# Patient Record
Sex: Female | Born: 1985 | Hispanic: No | Marital: Single | Smoking: Never smoker
Health system: Southern US, Community
[De-identification: ages and names within clinical notes are randomized; demographics above are authoritative.]

## PROBLEM LIST (undated history)

## (undated) DIAGNOSIS — E039 Hypothyroidism, unspecified: Secondary | ICD-10-CM

## (undated) DIAGNOSIS — N809 Endometriosis, unspecified: Secondary | ICD-10-CM

## (undated) DIAGNOSIS — I872 Venous insufficiency (chronic) (peripheral): Secondary | ICD-10-CM

---
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
## 2019-07-29 ENCOUNTER — Other Ambulatory Visit: Payer: Self-pay

## 2019-07-29 ENCOUNTER — Emergency Department (HOSPITAL_COMMUNITY): Payer: BC Managed Care – PPO

## 2019-07-29 ENCOUNTER — Emergency Department (HOSPITAL_COMMUNITY)
Admission: EM | Admit: 2019-07-29 | Discharge: 2019-07-29 | Disposition: A | Discharge status: Home / Self Care | Payer: BC Managed Care – PPO | Attending: Emergency Medicine | Admitting: Emergency Medicine

## 2019-07-29 ENCOUNTER — Encounter (HOSPITAL_COMMUNITY): Payer: Self-pay | Admitting: Emergency Medicine

## 2019-07-29 DIAGNOSIS — E039 Hypothyroidism, unspecified: Secondary | ICD-10-CM | POA: Insufficient documentation

## 2019-07-29 DIAGNOSIS — R55 Syncope and collapse: Secondary | ICD-10-CM | POA: Diagnosis present

## 2019-07-29 DIAGNOSIS — Z20822 Contact with and (suspected) exposure to covid-19: Secondary | ICD-10-CM | POA: Insufficient documentation

## 2019-07-29 HISTORY — DX: Venous insufficiency (chronic) (peripheral): I87.2

## 2019-07-29 HISTORY — DX: Endometriosis, unspecified: N80.9

## 2019-07-29 HISTORY — DX: Hypothyroidism, unspecified: E03.9

## 2019-07-29 LAB — CBC
HCT: 41.5 % (ref 36.0–46.0)
Hemoglobin: 13.5 g/dL (ref 12.0–15.0)
MCH: 27.9 pg (ref 26.0–34.0)
MCHC: 32.5 g/dL (ref 30.0–36.0)
MCV: 85.7 fL (ref 80.0–100.0)
Platelets: 275 10*3/uL (ref 150–400)
RBC: 4.84 MIL/uL (ref 3.87–5.11)
RDW: 13.6 % (ref 11.5–15.5)
WBC: 9.4 10*3/uL (ref 4.0–10.5)
nRBC: 0 % (ref 0.0–0.2)

## 2019-07-29 LAB — BASIC METABOLIC PANEL
Anion gap: 8 (ref 5–15)
BUN: 11 mg/dL (ref 6–20)
CO2: 25 mmol/L (ref 22–32)
Calcium: 9.2 mg/dL (ref 8.9–10.3)
Chloride: 106 mmol/L (ref 98–111)
Creatinine, Ser: 0.91 mg/dL (ref 0.44–1.00)
GFR calc Af Amer: >60 mL/min (ref >60)
GFR calc non Af Amer: >60 mL/min (ref >60)
Glucose, Bld: 90 mg/dL (ref 70–99)
Potassium: 4.1 mmol/L (ref 3.5–5.1)
Sodium: 139 mmol/L (ref 135–145)

## 2019-07-29 LAB — I-STAT BETA HCG BLOOD, ED (MC, WL, AP ONLY): I-stat hCG, quantitative: <5 m[IU]/mL (ref <5)

## 2019-07-29 LAB — URINALYSIS, ROUTINE W REFLEX MICROSCOPIC
Bilirubin Urine: NEGATIVE
Glucose, UA: NEGATIVE mg/dL
Hgb urine dipstick: NEGATIVE
Ketones, ur: 20 mg/dL — AB
Leukocytes,Ua: NEGATIVE
Nitrite: NEGATIVE
Protein, ur: NEGATIVE mg/dL
Specific Gravity, Urine: 1.016 (ref 1.005–1.030)
pH: 5 (ref 5.0–8.0)

## 2019-07-29 LAB — D-DIMER, QUANTITATIVE: D-Dimer, Quant: <0.27 ug/mL-FEU (ref 0.00–0.50)

## 2019-07-29 LAB — TSH: TSH: 1.455 u[IU]/mL (ref 0.350–4.500)

## 2019-07-29 LAB — CBG MONITORING, ED
Glucose-Capillary: 70 mg/dL (ref 70–99)
Glucose-Capillary: 80 mg/dL (ref 70–99)

## 2019-07-29 LAB — TROPONIN I (HIGH SENSITIVITY)
Troponin I (High Sensitivity): <2 ng/L (ref <18)
Troponin I (High Sensitivity): <2 ng/L (ref <18)

## 2019-07-29 LAB — SARS CORONAVIRUS 2 BY RT PCR (HOSPITAL ORDER, PERFORMED IN ~~LOC~~ HOSPITAL LAB): SARS Coronavirus 2: NEGATIVE

## 2019-07-29 MED ORDER — SODIUM CHLORIDE 0.9 % IV BOLUS
1000.0000 mL | Freq: Once | INTRAVENOUS | Status: AC
  Administered 2019-07-29: 1000 mL via INTRAVENOUS

## 2019-07-29 MED ORDER — SODIUM CHLORIDE 0.9% FLUSH: 3.0000 mL | Freq: Once | INTRAVENOUS | Status: DC

## 2019-07-29 NOTE — ED Triage Notes (Signed)
Pt in via [facility] w/dizziness since waking at 0400. States she woke feeling fatigued, and nauseous w/ambulation. Denies any fevers, v/d or sick contacts. BP 104/72, runs low baseline, CBG 119, MAE's equally

## 2019-07-29 NOTE — ED Notes (Signed)
Patient verbalizes understanding of discharge instructions. Opportunity for questioning and answers were provided. Armband removed by staff, pt discharged from ED.  

## 2019-07-29 NOTE — ED Notes (Signed)
Urine culture collected with UA °

## 2019-07-29 NOTE — Discharge Instructions (Addendum)
You were seen in the emergency department today for chest pain. Your work-up in the emergency department has been overall reassuring. Your labs have been fairly normal and or similar to previous blood work you have had done. Your EKG and the enzyme we use to check your heart did not show an acute heart attack at this time. Your chest x-ray was normal.   Please be sure to stay well-hydrated and eat 3 meals per day.  We would like you to follow up closely with your primary care provider within 1 - 3 days. Return to the ER immediately should you experience any new or worsening symptoms including but not limited to return of chest pain, worsened pain, vomiting, shortness of breath, dizziness, lightheadedness, passing out, or any other concerns that you may have.   See results below:  We tested you for COVID-19, we will call you if these results are positive, you may also view positive results on MyChart, if positive you will need to quarantine, see attached handout. Results for orders placed or performed during the hospital encounter of 63/01/60  Basic metabolic panel  Result Value Ref Range   Sodium 139 135 - 145 mmol/L   Potassium 4.1 3.5 - 5.1 mmol/L   Chloride 106 98 - 111 mmol/L   CO2 25 22 - 32 mmol/L   Glucose, Bld 90 70 - 99 mg/dL   BUN 11 6 - 20 mg/dL   Creatinine, Ser 0.91 0.44 - 1.00 mg/dL   Calcium 9.2 8.9 - 10.3 mg/dL   GFR calc non Af Amer >60 >60 mL/min   GFR calc Af Amer >60 >60 mL/min   Anion gap 8 5 - 15  CBC  Result Value Ref Range   WBC 9.4 4.0 - 10.5 K/uL   RBC 4.84 3.87 - 5.11 MIL/uL   Hemoglobin 13.5 12.0 - 15.0 g/dL   HCT 41.5 36.0 - 46.0 %   MCV 85.7 80.0 - 100.0 fL   MCH 27.9 26.0 - 34.0 pg   MCHC 32.5 30.0 - 36.0 g/dL   RDW 13.6 11.5 - 15.5 %   Platelets 275 150 - 400 K/uL   nRBC 0.0 0.0 - 0.2 %  Urinalysis, Routine w reflex microscopic  Result Value Ref Range   Color, Urine YELLOW YELLOW   APPearance CLEAR CLEAR   Specific Gravity, Urine 1.016 1.005 -  1.030   pH 5.0 5.0 - 8.0   Glucose, UA NEGATIVE NEGATIVE mg/dL   Hgb urine dipstick NEGATIVE NEGATIVE   Bilirubin Urine NEGATIVE NEGATIVE   Ketones, ur 20 (A) NEGATIVE mg/dL   Protein, ur NEGATIVE NEGATIVE mg/dL   Nitrite NEGATIVE NEGATIVE   Leukocytes,Ua NEGATIVE NEGATIVE  D-dimer, quantitative (not at [facility])  Result Value Ref Range   D-Dimer, Quant <0.27 0.00 - 0.50 ug/mL-FEU  TSH  Result Value Ref Range   TSH 1.455 0.350 - 4.500 uIU/mL  CBG monitoring, ED  Result Value Ref Range   Glucose-Capillary 70 70 - 99 mg/dL  I-Stat beta hCG blood, ED  Result Value Ref Range   I-stat hCG, quantitative <5.0 <5 mIU/mL   Comment 3          CBG monitoring, ED  Result Value Ref Range   Glucose-Capillary 80 70 - 99 mg/dL  Troponin I (High Sensitivity)  Result Value Ref Range   Troponin I (High Sensitivity) <2 <18 ng/L  Troponin I (High Sensitivity)  Result Value Ref Range   Troponin I (High Sensitivity) <2 <18 ng/L  DG Chest 2 View  Result Date: 07/29/2019 CLINICAL DATA:  Dyspnea.  Dizziness beginning this morning. EXAM: CHEST - 2 VIEW COMPARISON:  None. FINDINGS: The heart size and mediastinal contours are within normal limits. Both lungs are clear. No pleural effusion or pneumothorax. The visualized skeletal structures are unremarkable. IMPRESSION: Normal chest radiographs. Electronically Signed   By: Amie Portland M.D.   On: 07/29/2019 10:25

## 2019-07-29 NOTE — ED Provider Notes (Signed)
[facility] EMERGENCY DEPARTMENT Provider Note   CSN: [id number] Arrival date & time: 07/29/19  6644     History Chief Complaint  Patient presents with  . Near Syncope    Judith Bruce is a 34 y.o. female with a history of hypothyroidism, chronic venous insufficiency, and endometriosis who presents to the ED with complaints of near syncope this AM. Patient states she woke from sleep and went to go check on her sister, as she was walking there she began to feel overheated & lightheaded as if she may pass out with some associated dyspnea. She sat down on the couch and did not move as she was afraid if she did she would have LOC. No other alleviating/aggravating factors. She feels better now, but remains a bit lightheaded and short of breath. She states after feeling as if she my pass out she also developed some mild chest discomfort that is substernal, non-radiating, and difficult to qualify.  She denies fever, emesis, abdominal pain, acute unilatera leg pain/swelling, hemoptysis, recent surgery/trauma, recent long travel, hormone use, personal hx of cancer, or hx of DVT/PE.  She had not had anything to eat or drink this morning and did not eat dinner last night.  No recent medication changes. She has had similar sxs previously.   HPI     Past Medical History:  Diagnosis Date  . Chronic venous insufficiency   . Endometriosis   . Hypothyroidism     There are no problems to display for this patient.   History reviewed. No pertinent surgical history.   OB History   No obstetric history on file.     History reviewed. No pertinent family history.  Social History   Tobacco Use  . Smoking status: Never Smoker  . Smokeless tobacco: Never Used  Substance Use Topics  . Alcohol use: Never  . Drug use: Never    Home Medications Prior to Admission medications   Not on File    Allergies    Patient has no allergy information on record.  Review of Systems     Review of Systems  Constitutional: Negative for chills and fever.  Respiratory: Positive for shortness of breath.   Cardiovascular: Positive for chest pain. Negative for leg swelling.  Gastrointestinal: Negative for abdominal pain, blood in stool, constipation, diarrhea and vomiting.  Genitourinary: Negative for dysuria.  Neurological: Positive for light-headedness. Negative for syncope.  All other systems reviewed and are negative.   Physical Exam Updated Vital Signs BP 112/77   Pulse 66   Temp (!) 97.5 F (36.4 C) (Oral)   Resp 11   Wt 58.5 kg   LMP 03/21/2019 Comment: birth control  SpO2 100%   Physical Exam Vitals and nursing note reviewed.  Constitutional:      General: She is not in acute distress.    Appearance: She is well-developed. She is not toxic-appearing.  HENT:     Head: Normocephalic and atraumatic.  Eyes:     General:        Right eye: No discharge.        Left eye: No discharge.     Conjunctiva/sclera: Conjunctivae normal.  Cardiovascular:     Rate and Rhythm: Normal rate and regular rhythm.     Pulses:          Radial pulses are 2+ on the right side and 2+ on the left side.     Heart sounds: No murmur.  Pulmonary:     Effort:  Pulmonary effort is normal. No respiratory distress.     Breath sounds: Normal breath sounds. No wheezing, rhonchi or rales.  Abdominal:     General: There is no distension.     Palpations: Abdomen is soft.     Tenderness: There is no abdominal tenderness.  Musculoskeletal:     Cervical back: Neck supple.     Right lower leg: No edema.     Left lower leg: No edema.  Skin:    General: Skin is warm and dry.     Findings: No rash.  Neurological:     Mental Status: She is alert.     Comments: Clear speech.  CN II to XII grossly intact.  Sensation and strength grossly intact throughout bilateral upper and lower extremities.  Psychiatric:        Behavior: Behavior normal.    ED Results / Procedures / Treatments    Labs (all labs ordered are listed, but only abnormal results are displayed) Labs Reviewed  URINALYSIS, ROUTINE W REFLEX MICROSCOPIC - Abnormal; Notable for the following components:      Result Value   Ketones, ur 20 (*)    All other components within normal limits  SARS CORONAVIRUS 2 BY RT PCR (HOSPITAL ORDER, PERFORMED IN [facility])  BASIC METABOLIC PANEL  CBC  D-DIMER, QUANTITATIVE (NOT AT [facility])  TSH  CBG MONITORING, ED  I-STAT BETA HCG BLOOD, ED ([facility], [facility], [facility] ONLY)  CBG MONITORING, ED  TROPONIN I (HIGH SENSITIVITY)  TROPONIN I (HIGH SENSITIVITY)    EKG EKG Interpretation  Date/Time:  Monday Jul 29 2019 07:09:53 EDT Ventricular Rate:  67 PR Interval:  138 QRS Duration: 82 QT Interval:  386 QTC Calculation: 407 R Axis:   99 Text Interpretation: Normal sinus rhythm Rightward axis Borderline ECG Confirmed by Virgina Norfolk [phone]) on 07/29/2019 9:04:42 AM   Radiology DG Chest 2 View  Result Date: 07/29/2019 CLINICAL DATA:  Dyspnea.  Dizziness beginning this morning. EXAM: CHEST - 2 VIEW COMPARISON:  None. FINDINGS: The heart size and mediastinal contours are within normal limits. Both lungs are clear. No pleural effusion or pneumothorax. The visualized skeletal structures are unremarkable. IMPRESSION: Normal chest radiographs. Electronically Signed   By: Amie Portland M.D.   On: 07/29/2019 10:25    Procedures Procedures (including critical care time)  1:30PM Cardiac monitoring reveals NSR at a rate of 64 bpm, as reviewed and interpreted by me. Cardiac monitoring was ordered due to near syncopal episode and to monitor patient for dysrhythmia.  Medications Ordered in ED Medications  sodium chloride flush (NS) 0.9 % injection 3 mL (3 mLs Intravenous Not Given 07/29/19 0951)  sodium chloride 0.9 % bolus 1,000 mL (1,000 mLs Intravenous New Bag/Given 07/29/19 1119)    ED Course  I have reviewed the triage vital signs and the nursing notes.  Pertinent labs &  imaging results that were available during my care of the patient were reviewed by me and considered in my medical decision making (see chart for details).    Judith Bruce was evaluated in Emergency Department on 07/29/2019 for the symptoms described in the history of present illness. He/she was evaluated in the context of the global COVID-19 pandemic, which necessitated consideration that the patient might be at risk for infection with the SARS-CoV-2 virus that causes COVID-19. Institutional protocols and algorithms that pertain to the evaluation of patients at risk for COVID-19 are in a state of rapid change based on information released by regulatory  bodies including the CDC and federal and state organizations. These policies and algorithms were followed during the patient's care in the ED.  MDM Rules/Calculators/A&P                      Patient presents to the ED with complaints of near syncope, had some chest discomfort following this. Nontoxic, vitals without significant abnormality, benign physical exam.   Additional history obtained:  Additional history obtained from nursing note and chart review.  EKG: Normal sinus rhythm Rightward axis Borderline ECG  Lab Tests:  I Ordered, reviewed, and interpreted labs, which included:  CBC: No anemia or leukocytosis. BMP: No significant electrolyte derangement.  Renal function preserved. Pregnancy test: Negative D-dimer: Within normal limits Troponin: No significant elevation TSH: Within normal limits Urinalysis: Ketonuria, no UTI. Imaging Studies ordered:  I ordered imaging studies which included chest xray, I independently visualized and interpreted imaging which was normal.  ED Course:  Patient with near syncopal episode with mild chest discomfort following.  Overall reassuring exam and work-up in the emergency department.  She has no focal neurologic deficits.  Her orthostatic vitals were not significantly abnormal. Orthostatic VS for the  past 24 hrs:  BP- Lying Pulse- Lying BP- Sitting Pulse- Sitting  07/29/19 1040 112/54 61 116/71 65    Her labs do not indicate renal failure, electrolyte derangement, critical anemia, or significant thyroid dysfunction.  Chest x-ray without underlying pathology.  Low risk HEAR score, EKG without significant ischemic changes, troponins are not significantly elevated, low suspicion for ACS.  Low risk Wells, D-dimer within normal limits, low suspicion for pulmonary embolism.  No widened mediastinum on chest x-ray, symmetric pulses, normotensive, doubt dissection.  Cardiac monitor has been reviewed and is overall reassuring without significant arrhythmias.  Patient given fluids and is tolerating p.o.  She overall appears appropriate for discharge home with primary care follow-up.  The last time she had a syncopal episode was when she had the swine flu, Covid testing ordered per discussion with patient. I Discussed importance of good hydration and eating 3 meals per day. I discussed results, treatment plan, need for follow-up, and return precautions with the patient. Provided opportunity for questions, patient confirmed understanding and is in agreement with plan.   Portions of this note were generated with [employer]. Dictation errors may occur despite best attempts at proofreading.  Final Clinical Impression(s) / ED Diagnoses Final diagnoses:  Near syncope    Rx / DC Orders ED Discharge Orders    None       Amaryllis Dyke, PA-C 07/29/19 1443    Lennice Sites, DO 07/29/19 1456

## 2021-05-12 ENCOUNTER — Emergency Department (HOSPITAL_BASED_OUTPATIENT_CLINIC_OR_DEPARTMENT_OTHER): Payer: Managed Care, Other (non HMO) | Admitting: Radiology

## 2021-05-12 ENCOUNTER — Other Ambulatory Visit: Payer: Self-pay

## 2021-05-12 ENCOUNTER — Emergency Department (HOSPITAL_BASED_OUTPATIENT_CLINIC_OR_DEPARTMENT_OTHER)
Admission: EM | Admit: 2021-05-12 | Discharge: 2021-05-12 | Disposition: A | Discharge status: Home / Self Care | Payer: Managed Care, Other (non HMO) | Attending: Emergency Medicine | Admitting: Emergency Medicine

## 2021-05-12 ENCOUNTER — Encounter (HOSPITAL_BASED_OUTPATIENT_CLINIC_OR_DEPARTMENT_OTHER): Payer: Self-pay | Admitting: Emergency Medicine

## 2021-05-12 DIAGNOSIS — M25531 Pain in right wrist: Secondary | ICD-10-CM | POA: Diagnosis not present

## 2021-05-12 DIAGNOSIS — M79643 Pain in unspecified hand: Secondary | ICD-10-CM

## 2021-05-12 DIAGNOSIS — S6991XA Unspecified injury of right wrist, hand and finger(s), initial encounter: Secondary | ICD-10-CM

## 2021-05-12 DIAGNOSIS — S63391A Traumatic rupture of other ligament of right wrist, initial encounter: Secondary | ICD-10-CM | POA: Diagnosis not present

## 2021-05-12 DIAGNOSIS — Y92481 Parking lot as the place of occurrence of the external cause: Secondary | ICD-10-CM | POA: Insufficient documentation

## 2021-05-12 DIAGNOSIS — R52 Pain, unspecified: Secondary | ICD-10-CM

## 2021-05-12 NOTE — Discharge Instructions (Addendum)
As we discussed, your work-up was reassuring for acute abnormalities.  I do suspect that you have a wrist sprain given your x-ray.  I have given you a brace for support for management of this.  In addition given the your area of tenderness at higher risk of having a missed scaphoid fracture on your x-ray.  If this continues to be a problematic, I recommend you follow-up with orthopedics for repeat imaging and evaluation.  In the interim, I recommend rest, ice, and elevation of areas of pain with Tylenol/ibuprofen as needed. ? ?Return if development of any new or worsening symptoms. ?
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
## 2021-05-12 NOTE — ED Notes (Signed)
Pt verbalizes understanding of discharge instructions. Opportunity for questioning and answers were provided. Pt discharged from ED to home.   ? ?

## 2021-08-13 ENCOUNTER — Ambulatory Visit: Payer: Managed Care, Other (non HMO) | Admitting: Physician Assistant

## 2021-08-16 ENCOUNTER — Ambulatory Visit (INDEPENDENT_AMBULATORY_CARE_PROVIDER_SITE_OTHER): Payer: Managed Care, Other (non HMO) | Admitting: Physician Assistant

## 2021-08-16 ENCOUNTER — Encounter: Payer: Self-pay | Admitting: Physician Assistant

## 2021-08-16 DIAGNOSIS — M25531 Pain in right wrist: Secondary | ICD-10-CM | POA: Diagnosis not present
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
## 2021-08-25 ENCOUNTER — Ambulatory Visit
Admission: RE | Admit: 2021-08-25 | Discharge: 2021-08-25 | Disposition: A | Discharge status: Home / Self Care | Payer: Managed Care, Other (non HMO) | Source: Ambulatory Visit | Attending: Physician Assistant | Admitting: Physician Assistant

## 2021-08-25 DIAGNOSIS — M25531 Pain in right wrist: Secondary | ICD-10-CM

## 2021-08-27 ENCOUNTER — Telehealth: Payer: Self-pay

## 2021-08-27 ENCOUNTER — Ambulatory Visit: Payer: Managed Care, Other (non HMO) | Admitting: Podiatry

## 2021-08-27 DIAGNOSIS — L603 Nail dystrophy: Secondary | ICD-10-CM | POA: Diagnosis not present

## 2021-08-27 NOTE — Telephone Encounter (Signed)
Patient has been contacted and appointment has been made.  

## 2021-08-27 NOTE — Telephone Encounter (Signed)
-----   Message from [facility] Persons, [place] sent at 08/27/2021 11:04 AM EDT ----- Can we have this patient get an MRI follow up with Dr. Frazier Butt? thanks ----- Message ----- From: Interface, Rad Results In Sent: 08/27/2021  10:59 AM EDT To: [place] Persons, [place]

## 2021-08-27 NOTE — Progress Notes (Signed)
   HPI: 36 y.o. female presenting today as a new patient for evaluation of an injury to the right great toenail.  Patient states that about 3-4 months ago she got a new pair of dancing shoes which caused injury to the right hallux toenail.  Currently she has not done anything for treatment.  She would like to have it evaluated.  She also has some numbness to the bilateral great toes that has been present for several years.  Past Medical History:  Diagnosis Date   Chronic venous insufficiency    Endometriosis    Hypothyroidism     No past surgical history on file.  Allergies  Allergen Reactions   Bactrim [Sulfamethoxazole-Trimethoprim]     itching , dry skin, acute bodily stiffness.    Naltrexone     Liquid version - respiratory issues back in December 2020   Shellfish Allergy     Hives, itching, cough,      Physical Exam: General: The patient is alert and oriented x3 in no acute distress.  Dermatology: Skin is warm, dry and supple bilateral lower extremities. Negative for open lesions or macerations.  Injury noted to the right hallux nail plate with some nail dystrophy to the medial portion of the nail.  There is some healthy regrowth along the base of the nail  Vascular: Palpable pedal pulses bilaterally. Capillary refill within normal limits.  Negative for any significant edema or erythema  Neurological: Light touch and protective threshold grossly intact.  Apparently there is some paresthesia with numbness with light touch along the great toe plantar medial aspect  Musculoskeletal Exam: No pedal deformities noted  Assessment: 1.  Dystrophic nail secondary to trauma right hallux nail plate with healthy regrowth at the base of the nail   Plan of Care:  1. Patient evaluated.  2.  OTC Tolcylen antifungal topical dispensed at checkout to apply to the right hallux nail plate as it grows out 3.  Recommend getting new dancing shoes that do not irritate the great toe 4.  Silicone  toe cap was dispensed to cushion the toe 5.  Return to clinic as needed  *Dances the Flamenco      Felecia Shelling, DPM [facility]  Dr. Felecia Shelling, DPM    [address] [place], [place] [address]                Office [phone]  Fax [phone]

## 2021-09-03 ENCOUNTER — Ambulatory Visit: Payer: Self-pay

## 2021-09-03 ENCOUNTER — Ambulatory Visit (INDEPENDENT_AMBULATORY_CARE_PROVIDER_SITE_OTHER): Payer: Managed Care, Other (non HMO) | Admitting: Orthopedic Surgery

## 2021-09-03 DIAGNOSIS — M25531 Pain in right wrist: Secondary | ICD-10-CM

## 2021-09-03 DIAGNOSIS — S63511A Sprain of carpal joint of right wrist, initial encounter: Secondary | ICD-10-CM

## 2021-09-07 DIAGNOSIS — S63511A Sprain of carpal joint of right wrist, initial encounter: Secondary | ICD-10-CM | POA: Insufficient documentation

## 2021-09-07 MED ORDER — MELOXICAM 7.5 MG PO TABS: 7.5000 mg | ORAL_TABLET | Freq: Every day | ORAL | 0 refills | Status: DC

## 2021-09-23 ENCOUNTER — Telehealth: Payer: Self-pay | Admitting: Orthopedic Surgery

## 2021-09-23 NOTE — Telephone Encounter (Signed)
Pt called requesting a call back from [facility] or Dr. Frazier Butt. Pt states she has been waiting for physical therapy per discussed at appt. Please send referral for physical therapy. Pt also has medical question about process of hand healing due to injury. Please call pt she was unsure should she also make an appt or wait to closer time after physical therapy appt. Explained to pt it probably would be best to wait to see progress of therapy sessions. Pt phone number is [phone].

## 2021-09-30 ENCOUNTER — Telehealth: Payer: Self-pay | Admitting: Orthopedic Surgery

## 2021-09-30 NOTE — Telephone Encounter (Signed)
Pt called again about update for call back and referral for physical therapy. Please call pt a soon as possible. She states she called last week. Pt phone number is [phone].

## 2021-10-04 ENCOUNTER — Other Ambulatory Visit: Payer: Self-pay | Admitting: Orthopedic Surgery

## 2021-10-04 DIAGNOSIS — S63511A Sprain of carpal joint of right wrist, initial encounter: Secondary | ICD-10-CM

## 2021-10-07 ENCOUNTER — Other Ambulatory Visit: Payer: Self-pay | Admitting: Orthopedic Surgery

## 2021-10-15 ENCOUNTER — Encounter: Payer: Self-pay | Admitting: Occupational Therapy

## 2021-10-15 ENCOUNTER — Ambulatory Visit: Payer: Managed Care, Other (non HMO) | Attending: Orthopedic Surgery | Admitting: Occupational Therapy

## 2021-10-15 DIAGNOSIS — M25531 Pain in right wrist: Secondary | ICD-10-CM | POA: Insufficient documentation

## 2021-10-15 DIAGNOSIS — M25631 Stiffness of right wrist, not elsewhere classified: Secondary | ICD-10-CM | POA: Diagnosis present

## 2021-10-15 DIAGNOSIS — M6281 Muscle weakness (generalized): Secondary | ICD-10-CM | POA: Diagnosis present
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
## 2021-10-18 ENCOUNTER — Ambulatory Visit: Payer: Managed Care, Other (non HMO) | Admitting: Occupational Therapy

## 2021-10-22 ENCOUNTER — Ambulatory Visit: Payer: Managed Care, Other (non HMO) | Admitting: Occupational Therapy

## 2021-10-25 ENCOUNTER — Encounter: Payer: Self-pay | Admitting: Occupational Therapy

## 2021-10-25 ENCOUNTER — Ambulatory Visit: Payer: Managed Care, Other (non HMO) | Admitting: Occupational Therapy

## 2021-10-25 DIAGNOSIS — M25631 Stiffness of right wrist, not elsewhere classified: Secondary | ICD-10-CM

## 2021-10-25 DIAGNOSIS — M25531 Pain in right wrist: Secondary | ICD-10-CM | POA: Diagnosis not present

## 2021-10-25 DIAGNOSIS — M6281 Muscle weakness (generalized): Secondary | ICD-10-CM
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
## 2021-11-05 ENCOUNTER — Ambulatory Visit: Payer: Managed Care, Other (non HMO) | Admitting: Occupational Therapy

## 2021-11-19 ENCOUNTER — Ambulatory Visit: Payer: Managed Care, Other (non HMO) | Attending: Orthopedic Surgery | Admitting: Occupational Therapy

## 2021-11-19 ENCOUNTER — Encounter: Payer: Self-pay | Admitting: Occupational Therapy

## 2021-11-19 DIAGNOSIS — M6281 Muscle weakness (generalized): Secondary | ICD-10-CM | POA: Insufficient documentation

## 2021-11-19 DIAGNOSIS — M25531 Pain in right wrist: Secondary | ICD-10-CM | POA: Insufficient documentation

## 2021-11-19 DIAGNOSIS — M25631 Stiffness of right wrist, not elsewhere classified: Secondary | ICD-10-CM | POA: Insufficient documentation
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
## 2021-11-26 ENCOUNTER — Ambulatory Visit: Payer: Managed Care, Other (non HMO) | Admitting: Occupational Therapy

## 2021-11-26 DIAGNOSIS — M25531 Pain in right wrist: Secondary | ICD-10-CM

## 2021-11-26 DIAGNOSIS — M25631 Stiffness of right wrist, not elsewhere classified: Secondary | ICD-10-CM

## 2021-11-26 DIAGNOSIS — M6281 Muscle weakness (generalized): Secondary | ICD-10-CM
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
## 2021-11-29 ENCOUNTER — Ambulatory Visit (INDEPENDENT_AMBULATORY_CARE_PROVIDER_SITE_OTHER): Payer: Managed Care, Other (non HMO) | Admitting: Rehabilitative and Restorative Service Providers"

## 2021-11-29 ENCOUNTER — Encounter: Payer: Self-pay | Admitting: Rehabilitative and Restorative Service Providers"

## 2021-11-29 DIAGNOSIS — M25631 Stiffness of right wrist, not elsewhere classified: Secondary | ICD-10-CM

## 2021-11-29 DIAGNOSIS — M25531 Pain in right wrist: Secondary | ICD-10-CM | POA: Diagnosis not present

## 2021-11-29 DIAGNOSIS — M6281 Muscle weakness (generalized): Secondary | ICD-10-CM
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
## 2021-11-30 ENCOUNTER — Ambulatory Visit: Payer: Managed Care, Other (non HMO) | Admitting: Occupational Therapy

## 2021-12-03 ENCOUNTER — Ambulatory Visit: Payer: Managed Care, Other (non HMO) | Admitting: Occupational Therapy

## 2021-12-07 ENCOUNTER — Ambulatory Visit: Payer: Managed Care, Other (non HMO) | Admitting: Occupational Therapy

## 2021-12-15 ENCOUNTER — Ambulatory Visit (INDEPENDENT_AMBULATORY_CARE_PROVIDER_SITE_OTHER): Payer: Managed Care, Other (non HMO) | Admitting: Rehabilitative and Restorative Service Providers"

## 2021-12-15 ENCOUNTER — Encounter: Payer: Self-pay | Admitting: Rehabilitative and Restorative Service Providers"

## 2021-12-15 DIAGNOSIS — M25531 Pain in right wrist: Secondary | ICD-10-CM | POA: Diagnosis not present

## 2021-12-15 DIAGNOSIS — M25631 Stiffness of right wrist, not elsewhere classified: Secondary | ICD-10-CM

## 2021-12-15 DIAGNOSIS — M6281 Muscle weakness (generalized): Secondary | ICD-10-CM

## 2021-12-15 NOTE — Therapy (Signed)
OUTPATIENT OCCUPATIONAL THERAPY TREATMENT & PROGRESS NOTE   Patient Name: DESSIREE SZE MRN: [id number] DOB:[birth year], 36 y.o., female Today's Date: 12/15/2021   PCP: not on file REFERRING PROVIDER: Sherilyn Cooter, MD   Progress Note  Reporting Period 10/15/21 to 12/15/21  See note below for Objective Data and Assessment of Progress/Goals.      END OF SESSION:   OT End of Session - 12/15/21 1034     Visit Number 6    Number of Visits 17    Date for OT Re-Evaluation 01/28/22    Authorization Type [employer]    Authorization - Visit Number 6    Authorization - Number of Visits 30    OT Start Time 3428    OT Stop Time 1114    OT Time Calculation (min) 39 min    Activity Tolerance Patient tolerated treatment well;No increased pain;Patient limited by pain;Patient limited by fatigue    Behavior During Therapy [facility] for tasks assessed/performed             Past Medical History:  Diagnosis Date   Chronic venous insufficiency    Endometriosis    Hypothyroidism    History reviewed. No pertinent surgical history. Patient Active Problem List   Diagnosis Date Noted   Sprain of right scapholunate ligament 09/07/2021   Pain in right wrist 08/16/2021    ONSET DATE: 05/12/21 DOI    REFERRING DIAG: J68.115B (ICD-10-CM) - Sprain of right scapholunate ligament    THERAPY DIAG:  Pain in right wrist  Stiffness of right wrist, not elsewhere classified  Muscle weakness (generalized)  Rationale for Evaluation and Treatment Rehabilitation  PERTINENT HISTORY: R wrist sprain w/ partial thickness tear of SL ligament; h/o EDS with generalized ligament laxity  PRECAUTIONS: Rigid custom orthosis for wrist immobilization, per Dr. Tempie Donning; Other: avoid grip and lift of objects (wrist distraction); weight bearing (compression)   SUBJECTIVE:   SUBJECTIVE STATEMENT: She arrives quite late after not coming in for ~2 weeks due to traveling and other medical visits, wearing orthotic  today. She reports that she will also be leaving the country soon for a trip. Today she states and demo's that she is able to touch thumb to RF now, but not to [facility] yet, still tender to S-L area. Arrives wearing orthotic, but states wearing much less often now. She states not feeling hypervigilant now and less guarded, but having jolts of pain at times, still.    PAIN: Are you having pain? Yes: NPRS scale: 3/10 Pain location: dorsal side of wrist Pain description: dull throb Aggravating factors: overuse/movement; sleeping Relieving factors: immobilization; compression   PLOF: Independent, Vocation/Vocational requirements: Education officer, community; computer work, and Leisure: Psychologist, clinical  PATIENT GOALS: "know best practices and techniques" for daily work, ergonomics, lifting, driving   OBJECTIVE:   UPPER EXTREMITY ROM      Active ROM Left Eval - 8/4 Right Eval - 8/4 Right 11/29/21 Right 12/15/21  Wrist flexion 79 50 21 (tender) 14 (14* PROM)  Wrist extension 71 32 44 (more tender) 33 (50* PROM)   Wrist ulnar deviation 45 24 12 37  Wrist radial deviation 25 11 18 20   Wrist pronation 58 61 92 92  Wrist supination 73 63 79 tender 82  DART THROWER    25* flexion, 45* ext (PROM approx = AROM)   (Blank rows = not tested)   12/15/21: 27# (pain point)    11/29/21: Right 6# ("pain point");  Left: 43#   OBSERVATIONS:   12/15/21:  carpals don't appear to have much motion/glide today with attempted A/PROM, also prominent swelling or possible dorsal leaning lunate noted today in dorsum of wrist. Not overly TTP.    TODAY'S TREATMENT:  12/15/21  Therapeutic Exercise She does AROM for new measures, showing improvements everywhere BUT wrist flexion and extension which continues to decrease, frustratingly. (OT decides to start mobilizing wrist bones to help with motion and glide.) HEP was reviewed briefly, OT also quickly educates on adding light, non-apinful wrist stretches in flex, ext as  well as finger stretches as tolerated- she states understanding (HEP as below):   Exercises - Bend and Pull Back Wrist SLOWLY  - 3-4 x daily - 1-2 sets - 10-15 reps - "Windshield Wipers"   - 3-4 x daily - 1-2 sets - 10-15 reps - Wrist AROM Dart Throwers Motion  - 3-4 x daily - 1-2 sets - 10-15 reps - Touch Thumb to [facility] Finger  - 3-4 x daily - 1-2 sets - 10 reps - Tendon Glides  - 3-4 x daily - 3-5 reps - 2-3 seconds hold - Seated Isometric Wrist Flexion Neutral  - 2-3 x daily - 1- 2 sets - 5-10 reps - 5-10 sec hold - Isometric Wrist Extension Pronated  - 2-3 x daily - 5-10 reps - 5-10 hold - Seated Isometric Wrist Radial Deviation with Manual Resistance  - 2-3 x daily - 2 sets - 15 reps - Thumb isometric strength (resist with other hand)   - 2-3 x daily - 5-10 reps - 5-10 sec hold - Wrist Flexion Stretch  - 4 x daily - 3-5 reps - 15 sec hold - Wrist Extension Stretch Pronated  - 4 x daily - 3-5 reps - 15 hold - HOOK Stretch  - 4 x daily - 3-5 reps - 15-20 sec hold - BACK KNUCKLE STRETCHES   - 4 x daily - 3-5 reps - 15 sec hold - Seated Finger Composite Flexion Stretch  - 4 x daily - 3-5 reps - 15 hold  Manual Therapy OT provides custom wrist strap to help wean from orthotic and she feel this help limit her pain when worn. OT also performs grade 2-3 manual joint mobilizations to proximal carpal row in opposite roll/glide for wrist flexion and extension increase, as she feels very stiff and carpals don't appear to be gliding well. She is edu for doing this herself manually or with wrist strap.  AROM in flexion improves from 14* to 28* after manual techniques done today.   Therapeutic Activities OT quickly reviews dart thrower motion, opposition, tendon gliding activities and also reminds to be as active as possible with non-painful activities. She states she is doing that, understands      PATIENT EDUCATION: Ongoing condition-specific education related to therapeutic interventions completed  this session. Also reviewed benefit of ice/heat for pain and edema management w/ pt verbalizing understanding. Person educated: Patient Education method: Explanation Education comprehension: verbalized understanding   HOME EXERCISE PROGRAM: MedBridge Access Code: [email or web address] URL: [email or web address]   GOALS: Goals reviewed with patient? No  SHORT TERM GOALS: Target date:  11/12/21       Status:  1 Pt will demonstrate understanding of wear and care of custom fabricated orthosis after completion of assessment and fit Baseline: administered during evaluation Met  2 Pt to verbalize understanding of all appropriate body mechanics and ergonomic considerations during work-related tasks for joint protection and facilitation of healing Baseline: decreased knowledge of strategies 12/15/21: progressing- still working to [employer] and  understanding.     LONG TERM GOALS: Target date:  01/28/22       Status:  1 Pt will increase functional use of RUE during all ADLs as evidenced by improving QuickDASH score to 19% or better to indicate function WNL Baseline: 29.5/100 Progressing  2 Pt will improve AROM in R wrist to at least 50 degrees of ext w/out pain, to restore functional motion for tasks like reach and grasp  Baseline: Right: 32 deg, Left 71 deg Progressing  3 Pt will improve AROM in R wrist to at least 80% of ulnar and radial deviation as compared to L side, to restore functional motion for computer-based work-related tasks Baseline: Right ulnar/radial: 24/11, Left ulnar/radial: 45/25 Progressing  4 Pt will improve grip strength in R, dominant hand to at least 25 lbs by discharge for functional use at home and in IADLs  Baseline: not assessed due to restrictions Progressing    ASSESSMENT:  CLINICAL IMPRESSION: 12/15/21: Pt has not been able to attend therapy regularly due to other medical conditions, traveling, etc. She has only been seen 6 total times in ~2 months. She has  only seen myself (OT/CHT) for 2 total visits. She has been having lingering pains and feeling of S-L / wrist instability, but it is slowly improving, though wrist motion has slowly gotten worse in flexion and extension. OT is suspicious for slight DISI issue and has also just started proximal carpal row joint mobilizations which were very helpful to radiocarpal join today. Other than that centralized pain/chronic pains are also a mild concern. We will request more visits and try to see her more often to develop consistency. She is recommended to come at least 1x week if not 2x week, as inconsistent follow-up has not been overly helpful in course of care to date.  She maintains that she is trying to avoid surgery at all costs, but still wants to get wrist feeling better than current state.   PLAN: OT FREQUENCY: 2x week recommended for best outcomes.  (Additional 8 visits requested (total of up to 15 visits))  OT DURATION: additional 6 weeks (from 12/17/21 - 01/28/22 as needed)   PLANNED INTERVENTIONS: self care/ADL training, therapeutic exercise, therapeutic activity, manual therapy, passive range of motion, splinting, electrical stimulation, ultrasound, iontophoresis, paraffin, fluidotherapy, compression bandaging, moist heat, cryotherapy, patient/family education, and DME and/or AE instructions  RECOMMENDED OTHER SERVICES: None  CONSULTED AND AGREED WITH PLAN OF CARE: Patient  PLAN FOR NEXT SESSION:  Check motion, new joint mob techniques and use of wrist strap. Continue with manual mobilizations as needed- try to safely regain wrist motion.    Benito Mccreedy, OTR/L, CHT 12/15/2021, 5:25 PM

## 2021-12-21 ENCOUNTER — Ambulatory Visit (INDEPENDENT_AMBULATORY_CARE_PROVIDER_SITE_OTHER): Payer: Managed Care, Other (non HMO) | Admitting: Rehabilitative and Restorative Service Providers"

## 2021-12-21 ENCOUNTER — Encounter: Payer: Self-pay | Admitting: Rehabilitative and Restorative Service Providers"

## 2021-12-21 DIAGNOSIS — M6281 Muscle weakness (generalized): Secondary | ICD-10-CM | POA: Diagnosis not present

## 2021-12-21 DIAGNOSIS — M25531 Pain in right wrist: Secondary | ICD-10-CM

## 2021-12-21 DIAGNOSIS — M25631 Stiffness of right wrist, not elsewhere classified: Secondary | ICD-10-CM

## 2021-12-21 NOTE — Therapy (Signed)
OUTPATIENT OCCUPATIONAL THERAPY TREATMENT NOTE   Patient Name: Judith Bruce MRN: [id number] DOB:[birth year], 36 y.o., female Today's Date: 12/21/2021   PCP: not on file REFERRING PROVIDER: Sherilyn Cooter, MD     END OF SESSION:   OT End of Session - 12/21/21 1355     Visit Number 7    Number of Visits 17    Date for OT Re-Evaluation 01/28/22    Authorization Type [employer]    Authorization - Visit Number --    Authorization - Number of Visits 30    OT Start Time 8127    OT Stop Time 5170    OT Time Calculation (min) 39 min    Activity Tolerance Patient tolerated treatment well;No increased pain;Patient limited by pain;Patient limited by fatigue    Behavior During Therapy [facility] for tasks assessed/performed;Anxious              Past Medical History:  Diagnosis Date   Chronic venous insufficiency    Endometriosis    Hypothyroidism    History reviewed. No pertinent surgical history. Patient Active Problem List   Diagnosis Date Noted   Sprain of right scapholunate ligament 09/07/2021   Pain in right wrist 08/16/2021    ONSET DATE: 05/12/21 DOI    REFERRING DIAG: Y17.494W (ICD-10-CM) - Sprain of right scapholunate ligament    THERAPY DIAG:  Pain in right wrist  Stiffness of right wrist, not elsewhere classified  Muscle weakness (generalized)  Rationale for Evaluation and Treatment Rehabilitation  PERTINENT HISTORY: R wrist sprain w/ partial thickness tear of SL ligament; h/o EDS with generalized ligament laxity  PRECAUTIONS: Rigid custom orthosis for wrist immobilization, per Dr. Tempie Donning; Other: avoid grip and lift of objects (wrist distraction); weight bearing (compression)   SUBJECTIVE:   SUBJECTIVE STATEMENT: She states doing well with HEP proprioceptive motion to wrist with strap- "it feels good."  She has generally little to no pain now or in past week, but unfortunately still had 1-2 episodes of intense 8/10+ "tearing" pain and subsequent swelling-  "my whole hand swelled up, fingers and wrist," after doing light "swiffering" for home care.   PAIN: Are you having pain? Yes: NPRS scale: 1-2/10 Pain location: dorsal side of wrist Pain description: dull throb Aggravating factors: overuse/movement; sleeping Relieving factors: immobilization; compression   PLOF: Independent, Vocation/Vocational requirements: Education officer, community; computer work, and Leisure: Psychologist, clinical  PATIENT GOALS: "know best practices and techniques" for daily work, ergonomics, lifting, driving   OBJECTIVE:   UPPER EXTREMITY ROM      Active ROM Left Eval - 8/4 Right Eval - 8/4 Right 11/29/21 Right 12/15/21 Rt 12/21/21  Wrist flexion 79 50 21 (tender) 14 (14* PROM) 19  Wrist extension 71 32 44 (more tender) 33 (50* PROM)  32  Wrist ulnar deviation 45 24 12 37   Wrist radial deviation _0 Wrist pronation 58 61 92 92   Wrist supination 73 63 79 tender 82   DART THROWER    25* flexion, 45* ext (PROM approx = AROM)    (Blank rows = not tested)   12/15/21: 27# (pain point)   11/29/21: Right 6# ("pain point");  Left: 43#   OBSERVATIONS:   12/21/21:  no overt "clunk" with scaphoid testing, but pt guarded and painful with PROM. Still slight dorsal prominence nearby the lunate area that is very tender to palpation. Concern for DISI continues    TODAY'S TREATMENT: 12/21/21:  Therapeutic Exercise She does AROM for new  measures, showing mild improvement.  OT reviews HEP with her (motion, self-joint proprioceptive technique, light stretches), and also re-educates on body structures/functions, typical healing patterns/time frames.  OT explains that while it's common to have some increased pain with light activities and HEP, it's somewhat abnormal to have high "tearing" pain and new/acute swelling.  OT is concerned for recurrence of partial tear or complete tear of S-L ligament or a DISI that is not responding well to conservative therapy and perhaps  causing painful bone-on-bone rubbing, etc. OT does advise her to keep doing parts of HEP that are not painful to her- to prevent increased pain and stiffness.    Neuro Re-education OT does proprioceptive joint mobilizations to wrist, gently to avoid pain, but also getting some mild motion through wrist (radiocarpal joint).    Self-care OT reviews use of bracing for support or immobilization, whichever is needed, and how to determine which is needed.  Also advises to avoid activities that give her sharp, tearing pain, she states understanding.      PATIENT EDUCATION: Ongoing condition-specific education related to therapeutic interventions completed this session. Also reviewed benefit of ice/heat for pain and edema management w/ pt verbalizing understanding. Person educated: Patient Education method: Explanation Education comprehension: verbalized understanding   HOME EXERCISE PROGRAM: MedBridge Access Code: [email or web address] URL: [email or web address]   GOALS: Goals reviewed with patient? No  SHORT TERM GOALS: Target date:  11/12/21       Status:  1 Pt will demonstrate understanding of wear and care of custom fabricated orthosis after completion of assessment and fit Baseline: administered during evaluation Met  2 Pt to verbalize understanding of all appropriate body mechanics and ergonomic considerations during work-related tasks for joint protection and facilitation of healing Baseline: decreased knowledge of strategies 12/15/21: progressing- still working to [employer] and understanding.     LONG TERM GOALS: Target date:  01/28/22       Status:  1 Pt will increase functional use of RUE during all ADLs as evidenced by improving QuickDASH score to 19% or better to indicate function WNL Baseline: 29.5/100 Progressing  2 Pt will improve AROM in R wrist to at least 50 degrees of ext w/out pain, to restore functional motion for tasks like reach and grasp  Baseline: Right: 32  deg, Left 71 deg Progressing  3 Pt will improve AROM in R wrist to at least 80% of ulnar and radial deviation as compared to L side, to restore functional motion for computer-based work-related tasks Baseline: Right ulnar/radial: 24/11, Left ulnar/radial: 45/25 Progressing  4 Pt will improve grip strength in R, dominant hand to at least 25 lbs by discharge for functional use at home and in IADLs  Baseline: not assessed due to restrictions Progressing    ASSESSMENT:  CLINICAL IMPRESSION: 12/21/21: Due to c/o of high "tearing pain" with light tasks, acute swelling episodes despite such a long time out from injury and the fact that she did have periods of true immobilization (per pt report), OT is concerned for recurrence of partial tear or complete tear of S-L ligament or a DISI that is not responding well to conservative therapy and perhaps causing painful bone-on-bone rubbing, etc. She was recommenced to have repeat imaging to help determine what is causing this acute pain and swelling. She has appt tomorrow with a hand/ortho doc. She will call/message [place] about results. We will withhold therapy until MD gives more guidance.    PLAN: OT FREQUENCY: 2x week recommended for best outcomes.  (Additional  8 visits requested (total of up to 15 visits))  OT DURATION: additional 6 weeks (from 12/17/21 - 01/28/22 as needed)   PLANNED INTERVENTIONS: self care/ADL training, therapeutic exercise, therapeutic activity, manual therapy, passive range of motion, splinting, electrical stimulation, ultrasound, iontophoresis, paraffin, fluidotherapy, compression bandaging, moist heat, cryotherapy, patient/family education, and DME and/or AE instructions  RECOMMENDED OTHER SERVICES: None  CONSULTED AND AGREED WITH PLAN OF CARE: Patient  PLAN FOR NEXT SESSION:  Withhold therapy until MD gives second option or new imaging an be done    [employer], OTR/L, CHT 12/21/2021, 3:46 PM

## 2021-12-27 ENCOUNTER — Ambulatory Visit (INDEPENDENT_AMBULATORY_CARE_PROVIDER_SITE_OTHER): Payer: Managed Care, Other (non HMO) | Admitting: Rehabilitative and Restorative Service Providers"

## 2021-12-27 ENCOUNTER — Encounter: Payer: Self-pay | Admitting: Rehabilitative and Restorative Service Providers"

## 2021-12-27 DIAGNOSIS — M6281 Muscle weakness (generalized): Secondary | ICD-10-CM | POA: Diagnosis not present

## 2021-12-27 DIAGNOSIS — M25531 Pain in right wrist: Secondary | ICD-10-CM | POA: Diagnosis not present

## 2021-12-27 DIAGNOSIS — M25631 Stiffness of right wrist, not elsewhere classified: Secondary | ICD-10-CM | POA: Diagnosis not present

## 2021-12-27 NOTE — Therapy (Signed)
OUTPATIENT OCCUPATIONAL THERAPY TREATMENT NOTE   Patient Name: LITSY EPTING MRN: [id number] DOB:12-05-1985, 36 y.o., female Today's Date: 12/27/2021   PCP: not on file REFERRING PROVIDER: Sherilyn Cooter, MD     END OF SESSION:   OT End of Session - 12/27/21 1200     Visit Number 8    Number of Visits 17    Date for OT Re-Evaluation 01/28/22    Authorization Type [employer]    Authorization - Number of Visits 30    OT Start Time 1157    OT Stop Time 1232    OT Time Calculation (min) 35 min    Activity Tolerance Patient tolerated treatment well;Patient limited by pain;Patient limited by fatigue    Behavior During Therapy [facility] for tasks assessed/performed;Anxious               Past Medical History:  Diagnosis Date   Chronic venous insufficiency    Endometriosis    Hypothyroidism    History reviewed. No pertinent surgical history. Patient Active Problem List   Diagnosis Date Noted   Sprain of right scapholunate ligament 09/07/2021   Pain in right wrist 08/16/2021    ONSET DATE: 05/12/21 DOI    REFERRING DIAG: X32.355D (ICD-10-CM) - Sprain of right scapholunate ligament    THERAPY DIAG:  Pain in right wrist  Stiffness of right wrist, not elsewhere classified  Muscle weakness (generalized)  Rationale for Evaluation and Treatment Rehabilitation  PERTINENT HISTORY: Now >7 months since initial injury. R wrist sprain w/ partial thickness tear of SL ligament; h/o EDS with generalized ligament laxity  PRECAUTIONS: Caution to not put painful stress at S-L interval.     SUBJECTIVE:   SUBJECTIVE STATEMENT: She returns after following-up with a new orthopedic group.  She states they did no imaging and told her that she was just stiff and not moving enough. She states they did not special testing for her wrist stability, etc., and she is not pleased with the encounter.  She states having a follow-up with Dr. Tempie Donning scheduled for 01/10/22.  She returned to therapy,  as she didn't want to stop trying to progress motion and ability while awaiting MD f/u.   PAIN: Are you having pain? Yes: NPRS scale: 1-2/10 Pain location: dorsal side of wrist Pain description: dull throb Aggravating factors: overuse/movement; sleeping Relieving factors: immobilization; compression   PLOF: Independent, Vocation/Vocational requirements: Education officer, community; computer work, and Leisure: Psychologist, clinical  PATIENT GOALS: "know best practices and techniques" for daily work, ergonomics, lifting, driving   OBJECTIVE:   UPPER EXTREMITY ROM      Active ROM Left Eval - 8/4 Right Eval - 8/4 Right 11/29/21 Right 12/15/21 Rt 12/21/21  Wrist flexion 79 50 21 (tender) 14 (14* PROM) 19  Wrist extension 71 32 44 (more tender) 33 (50* PROM)  32  Wrist ulnar deviation 45 24 12 37   Wrist radial deviation _0 Wrist pronation 58 61 92 92   Wrist supination 73 63 79 tender 82   DART THROWER    25* flexion, 45* ext (PROM approx = AROM)    (Blank rows = not tested)   12/15/21: 27# (pain point)   11/29/21: Right 6# ("pain point");  Left: 43#   OBSERVATIONS:   12/21/21:  no overt "clunk" with scaphoid testing, but pt guarded and painful with PROM. Still slight dorsal prominence nearby the lunate area that is very tender to palpation. Concern for DISI continues  TODAY'S TREATMENT: 12/27/21: OT performs gentle Grade 2-3 joint mobs to radiocarpal joint as well as mid carpal joint to promote proprioception and joint mechanics.  She performs AROM in wrist flex and ext which is more painful that dart-thrower's motion AROM.  (This is typical of S-L injuries, as the proximal row is stable in DTM.)  OT also does manual stretches to wrist in flexion/ext and DTM with HEP review. Last, she does fnl activity with medium pegs in DTM plane to pickup, manipulate, press and pull to encourage pain-free use of Rt hand. She leaves feeling no increased pain.    PATIENT  EDUCATION: Ongoing condition-specific education related to therapeutic interventions completed this session. Also reviewed benefit of ice/heat for pain and edema management w/ pt verbalizing understanding. Person educated: Patient Education method: Explanation Education comprehension: verbalized understanding   HOME EXERCISE PROGRAM: MedBridge Access Code: [email or web address] URL: [email or web address]   GOALS: Goals reviewed with patient? No  SHORT TERM GOALS: Target date:  11/12/21       Status:  1 Pt will demonstrate understanding of wear and care of custom fabricated orthosis after completion of assessment and fit Baseline: administered during evaluation Met  2 Pt to verbalize understanding of all appropriate body mechanics and ergonomic considerations during work-related tasks for joint protection and facilitation of healing Baseline: decreased knowledge of strategies 12/15/21: progressing- still working to [employer] and understanding.     LONG TERM GOALS: Target date:  01/28/22       Status:  1 Pt will increase functional use of RUE during all ADLs as evidenced by improving QuickDASH score to 19% or better to indicate function WNL Baseline: 29.5/100 Progressing  2 Pt will improve AROM in R wrist to at least 50 degrees of ext w/out pain, to restore functional motion for tasks like reach and grasp  Baseline: Right: 32 deg, Left 71 deg Progressing  3 Pt will improve AROM in R wrist to at least 80% of ulnar and radial deviation as compared to L side, to restore functional motion for computer-based work-related tasks Baseline: Right ulnar/radial: 24/11, Left ulnar/radial: 45/25 Progressing  4 Pt will improve grip strength in R, dominant hand to at least 25 lbs by discharge for functional use at home and in IADLs  Baseline: not assessed due to restrictions Progressing    ASSESSMENT:  CLINICAL IMPRESSION: 12/27/21: OT still has some concern for DISI (due to reports of acute  swelling and acute "tearing" sharp pain with light activities and slight dorsal deformity, also still point tender at S-L interval after 7 months) and f/u with hand surgeon still seems appropriate at this point. Imaging would also be helpful to see alignment of proximal and distal carpal rows. If DISI, SLAC, and recurrent/full tjick tears are ruled out, OT will continue to focus on mobilization and functional use vs taking less stressful approach. 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 OT will also consider a dart-thrower's motion dynamic orthotic to limit flex/ext (and thereby S-L motion) but allow midcarpal joint motion. This can be done in next sesion, if needed and could help rest SL interval and allow wrist motion at the same time.   12/21/21: Due to c/o of high "tearing pain" with light tasks, acute swelling episodes despite such a long time out from injury and the fact that she did have  periods of true immobilization (per pt report), OT is concerned for recurrence of partial tear or complete tear of S-L ligament or a DISI that is not responding well to conservative therapy and perhaps causing painful bone-on-bone rubbing, etc. She was recommenced to have repeat imaging to help determine what is causing this acute pain and swelling. She has appt tomorrow with a hand/ortho doc. She will call/message [place] about results. We will withhold therapy until MD gives more guidance.    PLAN: OT FREQUENCY: 2x week recommended for best outcomes.  (Additional 8 visits requested (total of up to 15 visits))  OT DURATION: additional 6 weeks (from 12/17/21 - 01/28/22 as needed)   PLANNED INTERVENTIONS: self care/ADL training, therapeutic exercise, therapeutic activity, manual therapy, passive range of motion, splinting, electrical stimulation,  ultrasound, iontophoresis, paraffin, fluidotherapy, compression bandaging, moist heat, cryotherapy, patient/family education, and DME and/or AE instructions  RECOMMENDED OTHER SERVICES: None  CONSULTED AND AGREED WITH PLAN OF CARE: Patient  PLAN FOR NEXT SESSION:  F/u in next session after MD f/u, check recommendations and consider DTM orthotic    Benito Mccreedy, OTR/L, CHT 12/27/2021, 2:04 PM

## 2021-12-28 ENCOUNTER — Encounter: Payer: Managed Care, Other (non HMO) | Admitting: Rehabilitative and Restorative Service Providers"

## 2021-12-30 ENCOUNTER — Encounter: Payer: Managed Care, Other (non HMO) | Admitting: Rehabilitative and Restorative Service Providers"

## 2022-01-04 ENCOUNTER — Encounter: Payer: Managed Care, Other (non HMO) | Admitting: Rehabilitative and Restorative Service Providers"

## 2022-01-10 ENCOUNTER — Encounter: Payer: Managed Care, Other (non HMO) | Admitting: Rehabilitative and Restorative Service Providers"

## 2022-01-11 ENCOUNTER — Encounter: Payer: Self-pay | Admitting: Rehabilitative and Restorative Service Providers"

## 2022-01-11 ENCOUNTER — Ambulatory Visit (INDEPENDENT_AMBULATORY_CARE_PROVIDER_SITE_OTHER): Payer: Managed Care, Other (non HMO) | Admitting: Rehabilitative and Restorative Service Providers"

## 2022-01-11 DIAGNOSIS — M6281 Muscle weakness (generalized): Secondary | ICD-10-CM

## 2022-01-11 DIAGNOSIS — M25631 Stiffness of right wrist, not elsewhere classified: Secondary | ICD-10-CM

## 2022-01-11 DIAGNOSIS — M25531 Pain in right wrist: Secondary | ICD-10-CM

## 2022-01-11 NOTE — Therapy (Addendum)
OUTPATIENT OCCUPATIONAL THERAPY TREATMENT NOTE   Patient Name: Judith Bruce MRN: [id number] DOB:1985-06-15, 36 y.o., female Today's Date: 01/11/2022   PCP: not on file REFERRING PROVIDER: Sherilyn Cooter, MD     END OF SESSION:   OT End of Session - 01/11/22 1309     Visit Number 9    Number of Visits 17    Date for OT Re-Evaluation 01/28/22    Authorization Type [employer]    Authorization - Number of Visits 30    OT Start Time 1309    OT Stop Time 1354    OT Time Calculation (min) 45 min    Equipment Utilized During Treatment orthotic materials    Activity Tolerance Patient tolerated treatment well;Patient limited by pain;Patient limited by fatigue    Behavior During Therapy [facility] for tasks assessed/performed;Anxious             Past Medical History:  Diagnosis Date   Chronic venous insufficiency    Endometriosis    Hypothyroidism    History reviewed. No pertinent surgical history. Patient Active Problem List   Diagnosis Date Noted   Sprain of right scapholunate ligament 09/07/2021   Pain in right wrist 08/16/2021    ONSET DATE: 05/12/21 DOI    REFERRING DIAG: O37.858I (ICD-10-CM) - Sprain of right scapholunate ligament    THERAPY DIAG:  Pain in right wrist  Stiffness of right wrist, not elsewhere classified  Muscle weakness (generalized)  Rationale for Evaluation and Treatment Rehabilitation  PERTINENT HISTORY: Now >7 months since initial injury. R wrist sprain w/ partial thickness tear of SL ligament; h/o EDS with generalized ligament laxity  PRECAUTIONS: Caution to not put painful stress at S-L interval.     SUBJECTIVE:   SUBJECTIVE STATEMENT: She returns after ~2 weeks, shows up late, states she had f/u with Dr. Tempie Donning who said her x-rays looked typical no collapse or widening of S-L interval, he will schedule MRI, though, to check soft tissues for fraying, non-healing. He does mention this could possibly be an extensor tenosynovitis problem.  She states not feeling any different.    PAIN: Are you having pain?  Yes: NPRS scale: 1-2/10 Pain location: dorsal side of wrist Pain description: dull throb Aggravating factors: overuse/movement; sleeping Relieving factors: immobilization; compression   PLOF: Independent, Vocation/Vocational requirements: Education officer, community; computer work, and Leisure: Psychologist, clinical  PATIENT GOALS: "know best practices and techniques" for daily work, ergonomics, lifting, driving   OBJECTIVE:   UPPER EXTREMITY ROM      Active ROM Left Eval - 8/4 Right Eval - 8/4 Right 11/29/21 Right 12/15/21 Rt 12/21/21  Wrist flexion 79 50 21 (tender) 14 (14* PROM) 19  Wrist extension 71 32 44 (more tender) 33 (50* PROM)  32  Wrist ulnar deviation 45 24 12 37   Wrist radial deviation _0 Wrist pronation 58 61 92 92   Wrist supination 73 63 79 tender 82   DART THROWER    25* flexion, 45* ext (PROM approx = AROM)    (Blank rows = not tested)   12/15/21: 27# (pain point)   11/29/21: Right 6# ("pain point");  Left: 43#   OBSERVATIONS:   12/21/21:  no overt "clunk" with scaphoid testing, but pt guarded and painful with PROM. Still slight dorsal prominence nearby the lunate area that is very tender to palpation. Concern for DISI continues    TODAY'S TREATMENT: 01/11/22: OT does educate her that not all injuries heal back to "100%"  and that if MRI shows no damage, she may just be having symptoms of tightness and hypersensitivity that will need time and self-management to cope with, and other such self-care education.  She performs AROM of hand/wrist, and still has pain with finger ext and tight grip near extensor retinaculum and MD mentioned possible tenosynovitis there, OT holds her MF P1 in ~45* flexion and asks her to flex and extend.  This is not painful to her, so OT fabricates new, custom FA-based orthotic to hold wrist immobilized and MF MCP J at ~40* flexion. (This is treatment for  extensor tenosynovitis.)  It fits well and prevents pain at wrist and MF when worn. She is edu to rest in this as much as possible, day and night & return in a week to see if her pain with motion is better.  If so, we will continue to treat for tenosynovitis and consider trimming orthotic down to hand-based when tolerated.    PATIENT EDUCATION: Ongoing condition-specific education related to therapeutic interventions completed this session. Also reviewed benefit of ice/heat for pain and edema management w/ pt verbalizing understanding. Person educated: Patient Education method: Explanation Education comprehension: verbalized understanding   HOME EXERCISE PROGRAM: MedBridge Access Code: [email or web address] URL: [email or web address]   GOALS: Goals reviewed with patient? No  SHORT TERM GOALS: Target date:  11/12/21       Status:  1 Pt will demonstrate understanding of wear and care of custom fabricated orthosis after completion of assessment and fit Baseline: administered during evaluation Met  2 Pt to verbalize understanding of all appropriate body mechanics and ergonomic considerations during work-related tasks for joint protection and facilitation of healing Baseline: decreased knowledge of strategies 12/15/21: progressing- still working to [employer] and understanding.     LONG TERM GOALS: Target date:  01/28/22       Status:  1 Pt will increase functional use of RUE during all ADLs as evidenced by improving QuickDASH score to 19% or better to indicate function WNL Baseline: 29.5/100 Progressing  2 Pt will improve AROM in R wrist to at least 50 degrees of ext w/out pain, to restore functional motion for tasks like reach and grasp  Baseline: Right: 32 deg, Left 71 deg Progressing  3 Pt will improve AROM in R wrist to at least 80% of ulnar and radial deviation as compared to L side, to restore functional motion for computer-based work-related tasks Baseline: Right ulnar/radial:  24/11, Left ulnar/radial: 45/25 Progressing  4 Pt will improve grip strength in R, dominant hand to at least 25 lbs by discharge for functional use at home and in IADLs  Baseline: not assessed due to restrictions Progressing    ASSESSMENT:  CLINICAL IMPRESSION: 01/11/22: Her MD may be correct about extensor tenosynovitis, and we are trying conservative treatment for it now.  We will return to motion and stretches HEP once ext tendons no longer acutely painful with motion.  DISI, VISI, SLAC wrists have all been ruled out by MD and imaging.    12/27/21: OT still has some concern for DISI (due to reports of acute swelling and acute "tearing" sharp pain with light activities and slight dorsal deformity, also still point tender at S-L interval after 7 months) and f/u with hand surgeon still seems appropriate at this point. Imaging would also be helpful to see alignment of proximal and distal carpal rows. If DISI, SLAC, and recurrent/full tjick tears are ruled out, OT will continue to focus on mobilization and functional use vs taking  less stressful approach. 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 OT will also consider a dart-thrower's motion dynamic orthotic to limit flex/ext (and thereby S-L motion) but allow midcarpal joint motion. This can be done in next sesion, if needed and could help rest SL interval and allow wrist motion at the same time.    PLAN: OT FREQUENCY: 2x week recommended for best outcomes.  (Additional 8 visits requested (total of up to 15 visits))  OT DURATION: additional 6 weeks (from 12/17/21 - 01/28/22 as needed)   PLANNED INTERVENTIONS: self care/ADL training, therapeutic exercise, therapeutic activity, manual therapy, passive range of motion, splinting, electrical stimulation, ultrasound, iontophoresis,  paraffin, fluidotherapy, compression bandaging, moist heat, cryotherapy, patient/family education, and DME and/or AE instructions  RECOMMENDED OTHER SERVICES: None  CONSULTED AND AGREED WITH PLAN OF CARE: Patient  PLAN FOR NEXT SESSION:  Check new orthotic, trial motion to check pain at MF extensors, ensure not getting too stiff at wrist, etc.    Benito Mccreedy, OTR/L, CHT 01/11/2022, 5:39 PM

## 2022-01-17 NOTE — Therapy (Signed)
OUTPATIENT OCCUPATIONAL THERAPY TREATMENT NOTE   Patient Name: Judith Bruce MRN: [id number] DOB:12-10-1985, 36 y.o., female Today's Date: 01/18/2022   PCP: not on file REFERRING PROVIDER: Sherilyn Cooter, MD    END OF SESSION:   OT End of Session - 01/18/22 1312     Visit Number 10    Number of Visits 17    Date for OT Re-Evaluation 01/28/22    Authorization Type [employer]    Authorization - Number of Visits 30    OT Start Time 1683    OT Stop Time 1345    OT Time Calculation (min) 33 min    Activity Tolerance Patient tolerated treatment well;Patient limited by pain;Patient limited by fatigue    Behavior During Therapy [facility] for tasks assessed/performed;Anxious             Past Medical History:  Diagnosis Date   Chronic venous insufficiency    Endometriosis    Hypothyroidism    History reviewed. No pertinent surgical history. Patient Active Problem List   Diagnosis Date Noted   Sprain of right scapholunate ligament 09/07/2021   Pain in right wrist 08/16/2021    ONSET DATE: 05/12/21 DOI    REFERRING DIAG: F29.021J (ICD-10-CM) - Sprain of right scapholunate ligament    THERAPY DIAG:  Pain in right wrist  Muscle weakness (generalized)  Stiffness of right wrist, not elsewhere classified  Rationale for Evaluation and Treatment Rehabilitation  PERTINENT HISTORY: Now >7 months since initial injury. R wrist sprain w/ partial thickness tear of SL ligament; h/o EDS with generalized ligament laxity  PRECAUTIONS: Caution to not put painful stress at S-L interval.     SUBJECTIVE:   SUBJECTIVE STATEMENT: She states wearing orthotic for the week, not hurting/moving finger or wrist.  Only very slightly achy today 1/10.  Some visual increase in swelling now, to be expected. She has Mri scheduled for the 25th of November for her Rt hand/wrist.    PAIN: Are you having pain?  Yes: NPRS scale: 1/10 Pain location: dorsal side of wrist Pain description: dull  throb Aggravating factors: overuse/movement; sleeping Relieving factors: immobilization; compression   PLOF: Independent, Vocation/Vocational requirements: Education officer, community; computer work, and Leisure: Psychologist, clinical  PATIENT GOALS: "know best practices and techniques" for daily work, ergonomics, lifting, driving   OBJECTIVE:   UPPER EXTREMITY ROM      Active ROM Left Eval - 8/4 Right Eval - 8/4 Right 11/29/21 Right 12/15/21 Rt 12/21/21  Wrist flexion 79 50 21 (tender) 14 (14* PROM) 19  Wrist extension 71 32 44 (more tender) 33 (50* PROM)  32  Wrist ulnar deviation 45 24 12 37   Wrist radial deviation _0 Wrist pronation 58 61 92 92   Wrist supination 73 63 79 tender 82   DART THROWER    25* flexion, 45* ext (PROM approx = AROM)    (Blank rows = not tested)   12/15/21: 27# (pain point)   11/29/21: Right 6# ("pain point");  Left: 43#   OBSERVATIONS:   12/21/21:  no overt "clunk" with scaphoid testing, but pt guarded and painful with PROM. Still slight dorsal prominence nearby the lunate area that is very tender to palpation. Concern for DISI continues    TODAY'S TREATMENT: 01/18/22:  The orthotic was checked and a strap was adjusted for her comfort.  He tolerates light active range of motion at the middle finger today in flexion and extension with no pain whatsoever.  She also  tolerates passive range of motion and light stretches very well at wrist and finger.  Due to this OT adds PROM to finger and wrist 2-3 x day as tolerated.  She will continue in the brace for another week.  She states understanding to do self massage is using heat and ice as needed and not stressing out her finger or her wrist.   Exercises - Touch Thumb to [facility] Finger  - 3-4 x daily - 1-2 sets - 10 reps - Wrist Flexion Stretch  - 4 x daily - 3-5 reps - 15 sec hold - Wrist Extension Stretch Pronated  - 4 x daily - 3-5 reps - 15 hold - HOOK Stretch  - 4 x daily - 3-5 reps - 15-20 sec  hold - BACK KNUCKLE STRETCHES   - 4 x daily - 3-5 reps - 15 sec hold   01/11/22: OT does educate her that not all injuries heal back to "100%" and that if MRI shows no damage, she may just be having symptoms of tightness and hypersensitivity that will need time and self-management to cope with, and other such self-care education.  She performs AROM of hand/wrist, and still has pain with finger ext and tight grip near extensor retinaculum and MD mentioned possible tenosynovitis there, OT holds her MF P1 in ~45* flexion and asks her to flex and extend.  This is not painful to her, so OT fabricates new, custom FA-based orthotic to hold wrist immobilized and MF MCP J at ~40* flexion. (This is treatment for extensor tenosynovitis.)  It fits well and prevents pain at wrist and MF when worn. She is edu to rest in this as much as possible, day and night & return in a week to see if her pain with motion is better.  If so, we will continue to treat for tenosynovitis and consider trimming orthotic down to hand-based when tolerated.    PATIENT EDUCATION: Ongoing condition-specific education related to therapeutic interventions completed this session. Also reviewed benefit of ice/heat for pain and edema management w/ pt verbalizing understanding. Person educated: Patient Education method: Explanation Education comprehension: verbalized understanding   HOME EXERCISE PROGRAM: MedBridge Access Code: [email or web address] URL: [email or web address]   GOALS: Goals reviewed with patient? No  SHORT TERM GOALS: Target date:  11/12/21       Status:  1 Pt will demonstrate understanding of wear and care of custom fabricated orthosis after completion of assessment and fit Baseline: administered during evaluation Met  2 Pt to verbalize understanding of all appropriate body mechanics and ergonomic considerations during work-related tasks for joint protection and facilitation of healing Baseline: decreased knowledge  of strategies 12/15/21: progressing- still working to [employer] and understanding.     LONG TERM GOALS: Target date:  01/28/22       Status:  1 Pt will increase functional use of RUE during all ADLs as evidenced by improving QuickDASH score to 19% or better to indicate function WNL Baseline: 29.5/100 Progressing  2 Pt will improve AROM in R wrist to at least 50 degrees of ext w/out pain, to restore functional motion for tasks like reach and grasp  Baseline: Right: 32 deg, Left 71 deg Progressing  3 Pt will improve AROM in R wrist to at least 80% of ulnar and radial deviation as compared to L side, to restore functional motion for computer-based work-related tasks Baseline: Right ulnar/radial: 24/11, Left ulnar/radial: 45/25 Progressing  4 Pt will improve grip strength in R, dominant hand to at least  25 lbs by discharge for functional use at home and in IADLs  Baseline: not assessed due to restrictions Progressing    ASSESSMENT:  CLINICAL IMPRESSION: 01/18/22: Great improvement today in pain symptoms and dorsum of wrist/tenosynovitis middle finger extensor tendon seems to prove that this was a tenosynovitis issue.  We will continue treatment in this regard getting back to active range of motion and stretches at rest as well and when tolerated building up to strengthening and hand and wrist.  01/11/22: Her MD may be correct about extensor tenosynovitis, and we are trying conservative treatment for it now.  We will return to motion and stretches HEP once ext tendons no longer acutely painful with motion.  DISI, VISI, SLAC wrists have all been ruled out by MD and imaging.   PLAN: OT FREQUENCY: 2x week recommended for best outcomes.  (Additional 8 visits requested (total of up to 15 visits))  OT DURATION: additional 6 weeks (from 12/17/21 - 01/28/22 as needed)   PLANNED INTERVENTIONS: self care/ADL training, therapeutic exercise, therapeutic activity, manual therapy, passive range of motion,  splinting, electrical stimulation, ultrasound, iontophoresis, paraffin, fluidotherapy, compression bandaging, moist heat, cryotherapy, patient/family education, and DME and/or AE instructions  RECOMMENDED OTHER SERVICES: None  CONSULTED AND AGREED WITH PLAN OF CARE: Patient  PLAN FOR NEXT SESSION:  Check orthotic as needed check passive range of motion HEP upgrade to light active range of motion on repetitively if tolerated well   Benito Mccreedy, OTR/L, CHT 01/18/2022, 1:48 PM

## 2022-01-18 ENCOUNTER — Encounter: Payer: Self-pay | Admitting: Rehabilitative and Restorative Service Providers"

## 2022-01-18 ENCOUNTER — Ambulatory Visit (INDEPENDENT_AMBULATORY_CARE_PROVIDER_SITE_OTHER): Payer: Managed Care, Other (non HMO) | Admitting: Rehabilitative and Restorative Service Providers"

## 2022-01-18 DIAGNOSIS — M25631 Stiffness of right wrist, not elsewhere classified: Secondary | ICD-10-CM

## 2022-01-18 DIAGNOSIS — M25531 Pain in right wrist: Secondary | ICD-10-CM

## 2022-01-18 DIAGNOSIS — M6281 Muscle weakness (generalized): Secondary | ICD-10-CM

## 2022-01-24 NOTE — Therapy (Signed)
OUTPATIENT OCCUPATIONAL THERAPY TREATMENT NOTE   Patient Name: Judith Bruce MRN: [id number] DOB:[birth year], 36 y.o., female Today's Date: 01/25/2022   PCP: not on file REFERRING PROVIDER: Sherilyn Cooter, MD    END OF SESSION:    OT End of Session - 01/25/22 1309     Visit Number 11    Number of Visits 17    Date for OT Re-Evaluation 01/28/22    Authorization Type [employer]    Authorization - Number of Visits 30    OT Start Time 1309    OT Stop Time 1344    OT Time Calculation (min) 35 min    Equipment Utilized During Treatment orthotic supplies    Activity Tolerance Patient tolerated treatment well;Patient limited by fatigue;No increased pain    Behavior During Therapy WFL for tasks assessed/performed             Past Medical History:  Diagnosis Date   Chronic venous insufficiency    Endometriosis    Hypothyroidism    History reviewed. No pertinent surgical history. Patient Active Problem List   Diagnosis Date Noted   Sprain of right scapholunate ligament 09/07/2021   Pain in right wrist 08/16/2021    ONSET DATE: 05/12/21 DOI    REFERRING DIAG: K09.381W (ICD-10-CM) - Sprain of right scapholunate ligament    THERAPY DIAG:  Pain in right wrist  Muscle weakness (generalized)  Stiffness of right wrist, not elsewhere classified  Rationale for Evaluation and Treatment Rehabilitation  PERTINENT HISTORY: Now >7 months since initial injury. R wrist sprain w/ partial thickness tear of SL ligament; h/o EDS with generalized ligament laxity  PRECAUTIONS: Caution to not put painful stress at S-L interval.     SUBJECTIVE:   SUBJECTIVE STATEMENT: She states no significant pains in past week, has been trying to type and use hand for light things, and "staying in my zone," for stretches. Noticed seeing less swelling now.    PAIN: Are you having pain?  Yes: NPRS scale: 1/10 Pain location: dorsal side of wrist Pain description: dull throb Aggravating factors:  overuse/movement; sleeping Relieving factors: immobilization; compression   PLOF: Independent, Vocation/Vocational requirements: Education officer, community; computer work, and Leisure: Psychologist, clinical  PATIENT GOALS: "know best practices and techniques" for daily work, ergonomics, lifting, driving   OBJECTIVE:   UPPER EXTREMITY ROM      Active ROM Left Eval - 8/4 Right Eval - 8/4 Right 11/29/21 Right 12/15/21 Rt 12/21/21  Wrist flexion 79 50 21 (tender) 14 (14* PROM) 19  Wrist extension 71 32 44 (more tender) 33 (50* PROM)  32  Wrist ulnar deviation 45 24 12 37   Wrist radial deviation _0 Wrist pronation 58 61 92 92   Wrist supination 73 63 79 tender 82   DART THROWER    25* flexion, 45* ext (PROM approx = AROM)    (Blank rows = not tested)   12/15/21: 27# (pain point)   11/29/21: Right 6# ("pain point");  Left: 43#   OBSERVATIONS:   01/25/22: No pain today swelling is improved she has not had the sharp shooting pains as prior.  Some stiffness present from immobilization and also her cautiousness and guarding.   TODAY'S TREATMENT: 01/25/22: OT reviews last HEP (rest and light stretches 3 x day) and she states doing well, other than some pain in night when not wearing orthotic. OT has her on MH concurrent with review, 5 mins. OT then does manual therapy IASTM to MF  and dorsum of wrist as well as stretches to MF, Wrist, etc. She tolerates well, no increased pain.  OT then fabricates RMO for Rt hand digits 2-4 to hold MF in ext compared to adjacent fingers.  She has no pain at finger or wrist with composite flex/ext or wrist flex/ext while wearing this. She is edu to step down to this orthotic, and switch back to other orthotic if this becomes painful or hand becomes tired from increased use in day.  She states understanding new plan and HEP does not change, but encouraged to do more often if able and do all FNL activities as tolerated.   She does typing and peg activity in  session with no pain.     01/18/22:  The orthotic was checked and a strap was adjusted for her comfort.  He tolerates light active range of motion at the middle finger today in flexion and extension with no pain whatsoever.  She also tolerates passive range of motion and light stretches very well at wrist and finger.  Due to this OT adds PROM to finger and wrist 2-3 x day as tolerated.  She will continue in the brace for another week.  She states understanding to do self massage is using heat and ice as needed and not stressing out her finger or her wrist.  Exercises - Touch Thumb to [facility] Finger  - 3-4 x daily - 1-2 sets - 10 reps - Wrist Flexion Stretch  - 4 x daily - 3-5 reps - 15 sec hold - Wrist Extension Stretch Pronated  - 4 x daily - 3-5 reps - 15 hold - HOOK Stretch  - 4 x daily - 3-5 reps - 15-20 sec hold - BACK KNUCKLE STRETCHES   - 4 x daily - 3-5 reps - 15 sec hold  PATIENT EDUCATION: Ongoing condition-specific education related to therapeutic interventions completed this session. Also reviewed benefit of ice/heat for pain and edema management w/ pt verbalizing understanding. Person educated: Patient Education method: Explanation Education comprehension: verbalized understanding   HOME EXERCISE PROGRAM: MedBridge Access Code: [email or web address] URL: [email or web address]   GOALS: Goals reviewed with patient? No  SHORT TERM GOALS: Target date:  11/12/21       Status:  1 Pt will demonstrate understanding of wear and care of custom fabricated orthosis after completion of assessment and fit Baseline: administered during evaluation Met  2 Pt to verbalize understanding of all appropriate body mechanics and ergonomic considerations during work-related tasks for joint protection and facilitation of healing Baseline: decreased knowledge of strategies 01/25/22 MET     LONG TERM GOALS: Target date:  01/28/22       Status:  1 Pt will increase functional use of RUE during all  ADLs as evidenced by improving QuickDASH score to 19% or better to indicate function WNL Baseline: 29.5/100 Progressing  2 Pt will improve AROM in R wrist to at least 50 degrees of ext w/out pain, to restore functional motion for tasks like reach and grasp  Baseline: Right: 32 deg, Left 71 deg Progressing  3 Pt will improve AROM in R wrist to at least 80% of ulnar and radial deviation as compared to L side, to restore functional motion for computer-based work-related tasks Baseline: Right ulnar/radial: 24/11, Left ulnar/radial: 45/25 Progressing  4 Pt will improve grip strength in R, dominant hand to at least 25 lbs by discharge for functional use at home and in IADLs  Baseline: not assessed due to restrictions Progressing  ASSESSMENT:  CLINICAL IMPRESSION: 01/25/22: Treatment for extensor synovitis is working and seems to prove that this was an underlying issue.  She will now be moving more and relative motion orthotic and hopefully this will not exacerbate any pain.  She will not be seen for a week or 2 due to need for MRI and doctor follow-up.  OT asks her to share these new findings with the doctor.  PLAN: OT FREQUENCY: 2x week recommended for best outcomes.  (Additional 8 visits requested (total of up to 15 visits))  OT DURATION: additional 6 weeks (from 12/17/21 - 01/28/22 as needed)   PLANNED INTERVENTIONS: self care/ADL training, therapeutic exercise, therapeutic activity, manual therapy, passive range of motion, splinting, electrical stimulation, ultrasound, iontophoresis, paraffin, fluidotherapy, compression bandaging, moist heat, cryotherapy, patient/family education, and DME and/or AE instructions  RECOMMENDED OTHER SERVICES: None  CONSULTED AND AGREED WITH PLAN OF CARE: Patient  PLAN FOR NEXT SESSION:  Treatment for extensor synovitis is working and seems to prove that this was an underlying issue.  In next session OT will attempt to do full composite stretches and  weightbearing through the hand if she is well rested and nonpainful and feeling better.  This could even be her last visit if all is well.   Benito Mccreedy, OTR/L, CHT 01/25/2022, 2:12 PM

## 2022-01-25 ENCOUNTER — Encounter: Payer: Self-pay | Admitting: Rehabilitative and Restorative Service Providers"

## 2022-01-25 ENCOUNTER — Ambulatory Visit: Payer: Managed Care, Other (non HMO) | Admitting: Rehabilitative and Restorative Service Providers"

## 2022-01-25 DIAGNOSIS — M25531 Pain in right wrist: Secondary | ICD-10-CM | POA: Diagnosis not present

## 2022-01-25 DIAGNOSIS — M25631 Stiffness of right wrist, not elsewhere classified: Secondary | ICD-10-CM

## 2022-01-25 DIAGNOSIS — M6281 Muscle weakness (generalized): Secondary | ICD-10-CM | POA: Diagnosis not present

## 2022-02-07 NOTE — Therapy (Addendum)
OUTPATIENT OCCUPATIONAL THERAPY TREATMENT & DISCHARGE NOTE   Patient Name: Judith Bruce MRN: [id number] DOB:June 20, 1985, 36 y.o., female Today's Date: 02/08/2022   PCP: not on file REFERRING PROVIDER: Sherilyn Cooter, MD   Progress Note  Reporting Period 12/15/21 to 02/08/22.   See note below for Objective Data and Assessment of Progress/Goals.     END OF SESSION:    OT End of Session - 02/08/22 1342     Visit Number 12    Number of Visits 17    Date for OT Re-Evaluation 03/04/22    Authorization Type [employer]    Authorization - Number of Visits 30    OT Start Time 1344    OT Stop Time 1435    OT Time Calculation (min) 51 min    Equipment Utilized During Treatment --    Activity Tolerance Patient tolerated treatment well;Patient limited by fatigue;No increased pain    Behavior During Therapy WFL for tasks assessed/performed              Past Medical History:  Diagnosis Date   Chronic venous insufficiency    Endometriosis    Hypothyroidism    History reviewed. No pertinent surgical history. Patient Active Problem List   Diagnosis Date Noted   Sprain of right scapholunate ligament 09/07/2021   Pain in right wrist 08/16/2021    ONSET DATE: 05/12/21 DOI    REFERRING DIAG: [id number] (ICD-10-CM) - Sprain of right scapholunate ligament    THERAPY DIAG:  Pain in right wrist - Plan: Ot plan of care cert/re-cert  Stiffness of right wrist, not elsewhere classified - Plan: Ot plan of care cert/re-cert  Muscle weakness (generalized) - Plan: Ot plan of care cert/re-cert  Rationale for Evaluation and Treatment Rehabilitation  PERTINENT HISTORY: Now >7 months since initial injury. R wrist sprain w/ partial thickness tear of SL ligament; h/o EDS with generalized ligament laxity  PRECAUTIONS: Caution to not put painful stress at S-L interval.     SUBJECTIVE:   SUBJECTIVE STATEMENT: She states having extensor retinaculum injected this morning and feeling a little  sore from that.  She also states, "I feel stiffer now and less flexible in my wrist."  She is unsure why she is more stiff, but she has been working a lot more recently.   PAIN: Are you having pain? Yes in Rt  Rating: 3-4/10 at rest now, on average 2-3/10 at worst in past week    PLOF: Independent, Vocation/Vocational requirements: Education officer, community; computer work, and Leisure: Psychologist, clinical  PATIENT GOALS: "know best practices and techniques" for daily work, ergonomics, lifting, driving   OBJECTIVE:   UPPER EXTREMITY ROM      Active ROM Left Eval - 8/4 Right Eval - 8/4 Right 11/29/21 Right 12/15/21 Rt 12/21/21 Rt 02/08/22  Wrist flexion 79 50 21 (tender) 14 (14* PROM) 19 40 (50* PROM)  Wrist extension 71 32 44 (more tender) 33 (50* PROM)  32 22 (35 PROM)  Wrist ulnar deviation 45 24 12 37  11  Wrist radial deviation 25 11 18 20  25  $ Wrist pronation 58 61 92 92  WNL  Wrist supination 73 63 79 tender 82  WNL  DART THROWER    25* flexion, 45* ext (PROM approx = AROM)   18* Flexion, 33* Ext  pain in SL/trapezium area   (Blank rows = not tested)   02/08/22: Rt grip: 21# today  12/15/21: Rt grip: 27# (pain point)   11/29/21: Right 6# ("pain point");  Left: 43#    OBSERVATIONS:    02/08/22: Rt trapezium area and scaphoid are tender today volarly, and restricting wrist extension.  Injection in dorsum of hand seems to have helped flexion at wrist and lowered pain, however S-L lig appears more painful/tender today than in previous few sessions.  Apparently MRI showed unhealed partial tear, but no new damage, either.     TODAY'S TREATMENT: 02/08/22: Pt performs AROM, gripping, and strength with Rt hand/wrist for exercise as well as new measures today. Using that data, OT also reviews home exercises and provides appropriate upgrades as below. OT also discusses home and functional tasks with the pt and reviews goals.  OT states that it is very important for her to be using  her hand for all nonpainful activities and lightly stretching, not causing pain, avoiding staying in one posture for too long.  OT advises to start weaning from ROM home as tolerated and start composite middle finger stretches.  OT also advises to wear circumferential wrist strap a bit more as her wrist is more painful now.  OT also advises to increase frequency of stretching to every 30 minutes while at work, but always do non-painfully.  OT also role-played with her different scenarios about increasing tightness or pain and how she should react to it in terms of changing frequency or intensity of stretching and daily activities (for example- if she knew she was feeling tighter/stiffer today, how should she have reacted to that awareness? ->Increased non-painful stretches, incr. frequency and use of hand, as long as non-painful). Pt states understanding and tolerates stretches well today without significant pain.   Exercises - Touch Thumb to [facility] Finger  - 3-4 x daily - 1-2 sets - 10 reps - Wrist Flexion Stretch  - 4 x daily - 3-5 reps - 15 sec hold - Wrist Extension Stretch Pronated  - 4 x daily - 3-5 reps - 15 hold - Wrist AROM Dart Throwers Motion  - 4 x daily - 10 reps - Seated Finger Composite Flexion Stretch  - 4 x daily - 1-3 reps - 20 hold   PATIENT EDUCATION: Ongoing condition-specific education related to therapeutic interventions completed this session. Also reviewed benefit of ice/heat for pain and edema management w/ pt verbalizing understanding. Person educated: Patient Education method: Explanation Education comprehension: verbalized understanding   HOME EXERCISE PROGRAM: MedBridge Access Code: [email or web address] URL: [email or web address]   GOALS: Goals reviewed with patient? No  SHORT TERM GOALS: Target date:  11/12/21       Status:  1 Pt will demonstrate understanding of wear and care of custom fabricated orthosis after completion of assessment and fit Baseline:  administered during evaluation Met  2 Pt to verbalize understanding of all appropriate body mechanics and ergonomic considerations during work-related tasks for joint protection and facilitation of healing Baseline: decreased knowledge of strategies 01/25/22 MET     LONG TERM GOALS: Target date:  01/28/22       Status:  1 Pt will increase functional use of RUE during all ADLs as evidenced by improving QuickDASH score to 19% or better to indicate function WNL Baseline: 29.5/100 02/08/22: NT today  2 Pt will improve AROM in R wrist to at least 50 degrees of ext w/out pain, to restore functional motion for tasks like reach and grasp  Baseline: Right: 32 deg, Left 71 deg 02/08/22: NOT MET- worse today (though flexion better)  3 Pt will improve AROM in R wrist to at least 80% of ulnar and radial  deviation as compared to L side, to restore functional motion for computer-based work-related tasks Baseline: Right ulnar/radial: 24/11, Left ulnar/radial: 45/25 02/08/22: NOT MET and goal is D/C now   4 Pt will improve grip strength in R, dominant hand to at least 25 lbs by discharge for functional use at home and in IADLs  Baseline: not assessed due to restrictions 02/08/22: NOT MET (but was met previously)     ASSESSMENT:  CLINICAL IMPRESSION: 02/08/22: The good news is that 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  She has had injection, she is not painful in extensors, she can move her middle finger (and others) open and closed without problems or significant pain now- though she has a little tightness there still (understandable).    The bad news is that her wrist is frustratingly painful again near SL ligament and where scaphoid meets trapezium.  More bad news is that her wrist is still tight in certain ways and her hand remains weak.  Either she has been in poor, stiff postures at work for long periods of time or she has not been stretching and moving enough.  She was counseled on these  things, and because there is nothing much new to teach her in terms of recommendations or exercises today, we will put off follow-up visit for 2 or 3 weeks at least, as needed.  She was encouraged to come in sooner if pain is increasing for any reason, and she was also told she may cancel in 2 or 3 weeks if all (or most of) her problems have resolved.  She does appear to be very sensitive still and guarded at the wrist and fingers which has been unfortunately a very negative factor in the use and recovery of her hand (hasn't tolerated using a mouse yet).  OT role-played with her different scenarios about increasing tightness or pain and how she should react to it in terms of changing frequency or intensity of stretching and daily activities (for example- if she knew she was feeling tighter/stiffer today, how should she have reacted to that awareness? ->Increased non-painful stretches frequency).  She has a good plan and needs to be consistent with that.  If after all she is unhappy with her status and cannot find relief, she will be referred for surgery.   PLAN: OT FREQUENCY: 1 - 3 possible f/u visits as needed in next 5 weeks, PRN  OT DURATION: additional 5 weeks PRN (from 01/28/22- as needed)   PLANNED INTERVENTIONS: self care/ADL training, therapeutic exercise, therapeutic activity, manual therapy, passive range of motion, splinting, electrical stimulation, ultrasound, iontophoresis, paraffin, fluidotherapy, compression bandaging, moist heat, cryotherapy, patient/family education, and DME and/or AE instructions  CONSULTED AND AGREED WITH PLAN OF CARE: Patient  PLAN FOR NEXT SESSION:  See PRN in next 5 weeks to adjust POC or HEP to match pt's needs, otherwise D/C if no contact.   Benito Mccreedy, OTR/L, CHT 02/08/2022, 3:02 PM    OCCUPATIONAL THERAPY DISCHARGE SUMMARY  Visits from Start of Care: 12  She did come in late for appointment on 03/10/22, but stated having similar pains, MRI  results showing S-L instability, and so OT advised to her to continue OT recommendations and perhaps seek sx options, as chronic pain/wrist instability was not improving significantly with therapy alone. She is discharged at this point. See notes for details.   Benito Mccreedy, OTR/L, CHT 05/10/22

## 2022-02-08 ENCOUNTER — Ambulatory Visit (INDEPENDENT_AMBULATORY_CARE_PROVIDER_SITE_OTHER): Payer: Managed Care, Other (non HMO) | Admitting: Rehabilitative and Restorative Service Providers"

## 2022-02-08 ENCOUNTER — Encounter: Payer: Self-pay | Admitting: Rehabilitative and Restorative Service Providers"

## 2022-02-08 DIAGNOSIS — M25631 Stiffness of right wrist, not elsewhere classified: Secondary | ICD-10-CM | POA: Diagnosis not present

## 2022-02-08 DIAGNOSIS — M6281 Muscle weakness (generalized): Secondary | ICD-10-CM | POA: Diagnosis not present

## 2022-02-08 DIAGNOSIS — M25531 Pain in right wrist: Secondary | ICD-10-CM

## 2022-02-28 NOTE — Therapy (Incomplete)
OUTPATIENT OCCUPATIONAL THERAPY TREATMENT & PROGRESS *** NOTE   Patient Name: Judith Bruce MRN: [id number] DOB:[birth year], 36 y.o., female Today's Date: 02/08/2022   PCP: not on file REFERRING PROVIDER: Sherilyn Cooter, MD      END OF SESSION:    OT End of Session - 02/08/22 1342     Visit Number 12    Number of Visits 17    Date for OT Re-Evaluation 03/04/22    Authorization Type [employer]    Authorization - Number of Visits 30    OT Start Time 1344    OT Stop Time 1435    OT Time Calculation (min) 51 min    Equipment Utilized During Treatment --    Activity Tolerance Patient tolerated treatment well;Patient limited by fatigue;No increased pain    Behavior During Therapy WFL for tasks assessed/performed              Past Medical History:  Diagnosis Date   Chronic venous insufficiency    Endometriosis    Hypothyroidism    History reviewed. No pertinent surgical history. Patient Active Problem List   Diagnosis Date Noted   Sprain of right scapholunate ligament 09/07/2021   Pain in right wrist 08/16/2021    ONSET DATE: 05/12/21 DOI    REFERRING DIAG: D97.416L (ICD-10-CM) - Sprain of right scapholunate ligament    THERAPY DIAG:  Pain in right wrist - Plan: Ot plan of care cert/re-cert  Stiffness of right wrist, not elsewhere classified - Plan: Ot plan of care cert/re-cert  Muscle weakness (generalized) - Plan: Ot plan of care cert/re-cert  Rationale for Evaluation and Treatment Rehabilitation  PERTINENT HISTORY: Now >7 months since initial injury. R wrist sprain w/ partial thickness tear of SL ligament; h/o EDS with generalized ligament laxity  PRECAUTIONS: Caution to not put painful stress at S-L interval.     SUBJECTIVE:   SUBJECTIVE STATEMENT: She returns 3 weeks since last seen. She states ***  having extensor retinaculum injected this morning and feeling a little sore from that.  She also states, "I feel stiffer now and less flexible in my  wrist."  She is unsure why she is more stiff, but she has been working a lot more recently.   PAIN: Are you having pain? Yes in Rt *** Rating:  *** 3-4/10 at rest now, on average *** 2-3/10 at worst in past week    PLOF: Independent, Vocation/Vocational requirements: Education officer, community; computer work, and Leisure: Psychologist, clinical  PATIENT GOALS: "know best practices and techniques" for daily work, ergonomics, lifting, driving   OBJECTIVE:   UPPER EXTREMITY ROM      Active ROM Left Eval - 8/4 Right Eval - 8/4 Right 11/29/21 Right 12/15/21 Rt 02/08/22  Wrist flexion 79 50 21 (tender) 14 (14* PROM) 40 (50* PROM)  Wrist extension 71 32 44 (more tender) 33 (50* PROM)  22 (35 PROM)  Wrist ulnar deviation 45 24 12 37 11  Wrist radial deviation _0 Wrist pronation 58 61 92 92 WNL  Wrist supination 73 63 79 tender 82 WNL  DART THROWER    25* flexion, 45* ext (PROM approx = AROM)  18* Flexion, 33* Ext  pain in SL/trapezium area   (Blank rows = not tested)   02/08/22: Rt grip: 21# today  12/15/21: Rt grip: 27# (pain point)   11/29/21: Right 6# ("pain point");  Left: 43#    OBSERVATIONS:    02/08/22: Rt trapezium area and scaphoid are  tender today volarly, and restricting wrist extension.  Injection in dorsum of hand seems to have helped flexion at wrist and lowered pain, however S-L lig appears more painful/tender today than in previous few sessions.  Apparently MRI showed unhealed partial tear, but no new damage, either.     TODAY'S TREATMENT: 03/01/22: ***See PRN in next 5 weeks to adjust POC or HEP to match pt's needs, otherwise D/C if no contact.    02/08/22: Pt performs AROM, gripping, and strength with Rt hand/wrist for exercise as well as new measures today. Using that data, OT also reviews home exercises and provides appropriate upgrades as below. OT also discusses home and functional tasks with the pt and reviews goals.  OT states that it is very  important for her to be using her hand for all nonpainful activities and lightly stretching, not causing pain, avoiding staying in one posture for too long.  OT advises to start weaning from ROM home as tolerated and start composite middle finger stretches.  OT also advises to wear circumferential wrist strap a bit more as her wrist is more painful now.  OT also advises to increase frequency of stretching to every 30 minutes while at work, but always do non-painfully.  OT also role-played with her different scenarios about increasing tightness or pain and how she should react to it in terms of changing frequency or intensity of stretching and daily activities (for example- if she knew she was feeling tighter/stiffer today, how should she have reacted to that awareness? ->Increased non-painful stretches, incr. frequency and use of hand, as long as non-painful). Pt states understanding and tolerates stretches well today without significant pain.   Exercises - Touch Thumb to [facility] Finger  - 3-4 x daily - 1-2 sets - 10 reps - Wrist Flexion Stretch  - 4 x daily - 3-5 reps - 15 sec hold - Wrist Extension Stretch Pronated  - 4 x daily - 3-5 reps - 15 hold - Wrist AROM Dart Throwers Motion  - 4 x daily - 10 reps - Seated Finger Composite Flexion Stretch  - 4 x daily - 1-3 reps - 20 hold   PATIENT EDUCATION: Ongoing condition-specific education related to therapeutic interventions completed this session. Also reviewed benefit of ice/heat for pain and edema management w/ pt verbalizing understanding. Person educated: Patient Education method: Explanation Education comprehension: verbalized understanding   HOME EXERCISE PROGRAM: MedBridge Access Code: [email or web address] URL: [email or web address]   GOALS: Goals reviewed with patient? No  SHORT TERM GOALS: Target date:  11/12/21       Status:  1 Pt will demonstrate understanding of wear and care of custom fabricated orthosis after completion of  assessment and fit Baseline: administered during evaluation Met  2 Pt to verbalize understanding of all appropriate body mechanics and ergonomic considerations during work-related tasks for joint protection and facilitation of healing Baseline: decreased knowledge of strategies 01/25/22 MET     LONG TERM GOALS: Target date:  01/28/22       Status:  1 Pt will increase functional use of RUE during all ADLs as evidenced by improving QuickDASH score to 19% or better to indicate function WNL Baseline: 29.5/100 02/08/22: NT today  2 Pt will improve AROM in R wrist to at least 50 degrees of ext w/out pain, to restore functional motion for tasks like reach and grasp  Baseline: Right: 32 deg, Left 71 deg 02/08/22: NOT MET- worse today (though flexion better)  3 Pt will improve AROM in R wrist  to at least 80% of ulnar and radial deviation as compared to L side, to restore functional motion for computer-based work-related tasks Baseline: Right ulnar/radial: 24/11, Left ulnar/radial: 45/25 02/08/22: NOT MET and goal is D/C now   4 Pt will improve grip strength in R, dominant hand to at least 25 lbs by discharge for functional use at home and in IADLs  Baseline: not assessed due to restrictions 02/08/22: NOT MET (but was met previously)     ASSESSMENT:  CLINICAL IMPRESSION: 03/01/22: ***  02/08/22: The good news is that 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  She has had injection, she is not painful in extensors, she can move her middle finger (and others) open and closed without problems or significant pain now- though she has a little tightness there still (understandable).    The bad news is that her wrist is frustratingly painful again near SL ligament and where scaphoid meets trapezium.  More bad news is that her wrist is still tight in certain ways and her hand remains weak.  Either she has been in poor, stiff postures at work for long periods of time or she has not been stretching  and moving enough.  She was counseled on these things, and because there is nothing much new to teach her in terms of recommendations or exercises today, we will put off follow-up visit for 2 or 3 weeks at least, as needed.  She was encouraged to come in sooner if pain is increasing for any reason, and she was also told she may cancel in 2 or 3 weeks if all (or most of) her problems have resolved.  She does appear to be very sensitive still and guarded at the wrist and fingers which has been unfortunately a very negative factor in the use and recovery of her hand (hasn't tolerated using a mouse yet).  OT role-played with her different scenarios about increasing tightness or pain and how she should react to it in terms of changing frequency or intensity of stretching and daily activities (for example- if she knew she was feeling tighter/stiffer today, how should she have reacted to that awareness? ->Increased non-painful stretches frequency).  She has a good plan and needs to be consistent with that.  If after all she is unhappy with her status and cannot find relief, she will be referred for surgery.   PLAN: OT FREQUENCY: 1 - 3 possible f/u visits as needed in next 5 weeks, PRN  OT DURATION: additional 5 weeks PRN (from 01/28/22- as needed)   PLANNED INTERVENTIONS: self care/ADL training, therapeutic exercise, therapeutic activity, manual therapy, passive range of motion, splinting, electrical stimulation, ultrasound, iontophoresis, paraffin, fluidotherapy, compression bandaging, moist heat, cryotherapy, patient/family education, and DME and/or AE instructions  CONSULTED AND AGREED WITH PLAN OF CARE: Patient  PLAN FOR NEXT SESSION:  ***    Benito Mccreedy, OTR/L, CHT 02/08/2022, 3:02 PM

## 2022-03-01 ENCOUNTER — Encounter: Payer: Managed Care, Other (non HMO) | Admitting: Rehabilitative and Restorative Service Providers"

## 2022-03-10 ENCOUNTER — Encounter: Payer: Managed Care, Other (non HMO) | Admitting: Rehabilitative and Restorative Service Providers"
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
